# Patient Record
Sex: Female | Born: 1985 | Race: White | Hispanic: No | Marital: Married | Smoking: Never smoker
Health system: Southern US, Community
[De-identification: ages and names within clinical notes are randomized; demographics above are authoritative.]

## PROBLEM LIST (undated history)

## (undated) DIAGNOSIS — O039 Complete or unspecified spontaneous abortion without complication: Principal | ICD-10-CM

## (undated) DIAGNOSIS — Z3490 Encounter for supervision of normal pregnancy, unspecified, unspecified trimester: Principal | ICD-10-CM

## (undated) DIAGNOSIS — Z789 Other specified health status: Secondary | ICD-10-CM

## (undated) HISTORY — DX: Complete or unspecified spontaneous abortion without complication: O03.9

## (undated) HISTORY — DX: Encounter for supervision of normal pregnancy, unspecified, unspecified trimester: Z34.90

## (undated) HISTORY — PX: NO PAST SURGERIES: SHX2092

---

## 2009-02-20 ENCOUNTER — Ambulatory Visit: Payer: Self-pay | Admitting: Advanced Practice Midwife

## 2009-02-20 ENCOUNTER — Inpatient Hospital Stay (HOSPITAL_COMMUNITY): Admission: AD | Admit: 2009-02-20 | Discharge: 2009-02-21 | Payer: Self-pay | Admitting: Family Medicine

## 2009-05-27 ENCOUNTER — Inpatient Hospital Stay (HOSPITAL_COMMUNITY): Admission: AD | Admit: 2009-05-27 | Discharge: 2009-05-29 | Payer: Self-pay | Admitting: Obstetrics & Gynecology

## 2009-05-27 ENCOUNTER — Ambulatory Visit: Payer: Self-pay | Admitting: Advanced Practice Midwife

## 2010-04-05 ENCOUNTER — Emergency Department (HOSPITAL_COMMUNITY)
Admission: EM | Admit: 2010-04-05 | Discharge: 2010-04-05 | Payer: Self-pay | Source: Home / Self Care | Admitting: Emergency Medicine

## 2010-05-01 NOTE — L&D Delivery Note (Signed)
Delivery Note At 6:27 AM a viable female was delivered via Vaginal, Spontaneous Delivery (Presentation: ; Occiput Anterior).  APGAR: 8, 9; weight 9 lb 15 oz (4508 g).   Placenta status: Intact, Spontaneous.  Cord: 3 vessels with the following complications: mild shoulder dystocia.  After delivery of the head the shoulders were noted to be tight.  THe posterior (right) axilla was grasped with my index finger, the baby rotated counterclockwise into an oblique position, and the shoulder relased anteriorally.  The rest of the body delivered without difficulty   Dr. Tana Conch assisted with delivery Anesthesia: Epidural  Episiotomy: None Lacerations: 1st degree Suture Repair: 2.0 vicryl Est. Blood Loss (mL): <500cc  Mom to postpartum.  Baby to nursery-stable.  CRESENZO-DISHMAN,Erika Johnston 03/30/2011, 6:53 AM

## 2010-06-16 LAB — HIV ANTIBODY (ROUTINE TESTING W REFLEX): HIV: NONREACTIVE

## 2010-06-16 LAB — HEPATITIS B SURFACE ANTIGEN: Hepatitis B Surface Ag: NEGATIVE

## 2010-06-16 LAB — RPR: RPR: NONREACTIVE

## 2010-07-12 LAB — URINALYSIS, ROUTINE W REFLEX MICROSCOPIC
Hgb urine dipstick: NEGATIVE
Nitrite: NEGATIVE
Protein, ur: NEGATIVE mg/dL
Specific Gravity, Urine: >1.03 — ABNORMAL HIGH (ref 1.005–1.030)
Urobilinogen, UA: 0.2 mg/dL (ref 0.0–1.0)

## 2010-07-12 LAB — DIFFERENTIAL
Eosinophils Absolute: 0.2 10*3/uL (ref 0.0–0.7)
Eosinophils Relative: 2 % (ref 0–5)
Lymphocytes Relative: 23 % (ref 12–46)
Lymphs Abs: 2 10*3/uL (ref 0.7–4.0)
Monocytes Absolute: 0.9 10*3/uL (ref 0.1–1.0)

## 2010-07-12 LAB — CBC
HCT: 36.9 % (ref 36.0–46.0)
MCH: 31.1 pg (ref 26.0–34.0)
MCV: 87.6 fL (ref 78.0–100.0)
RBC: 4.21 MIL/uL (ref 3.87–5.11)
RDW: 12.6 % (ref 11.5–15.5)
WBC: 8.5 10*3/uL (ref 4.0–10.5)

## 2010-07-12 LAB — RPR: RPR Ser Ql: NONREACTIVE

## 2010-07-12 LAB — ABO/RH: ABO/RH(D): O POS

## 2010-07-12 LAB — PREGNANCY, URINE: Preg Test, Ur: POSITIVE

## 2010-07-12 LAB — WET PREP, GENITAL: Yeast Wet Prep HPF POC: NONE SEEN

## 2010-07-17 LAB — CBC
HCT: 29 % — ABNORMAL LOW (ref 36.0–46.0)
Hemoglobin: 12.4 g/dL (ref 12.0–15.0)
Hemoglobin: 9.7 g/dL — ABNORMAL LOW (ref 12.0–15.0)
MCV: 98.1 fL (ref 78.0–100.0)
RBC: 2.96 MIL/uL — ABNORMAL LOW (ref 3.87–5.11)
RBC: 3.82 MIL/uL — ABNORMAL LOW (ref 3.87–5.11)
WBC: 17.5 10*3/uL — ABNORMAL HIGH (ref 4.0–10.5)

## 2010-07-17 LAB — CCBB MATERNAL DONOR DRAW

## 2010-08-04 LAB — WET PREP, GENITAL: Clue Cells Wet Prep HPF POC: NONE SEEN

## 2011-02-15 LAB — STREP B DNA PROBE: GBS: NEGATIVE

## 2011-03-29 ENCOUNTER — Encounter (HOSPITAL_COMMUNITY): Payer: Self-pay | Admitting: *Deleted

## 2011-03-29 ENCOUNTER — Inpatient Hospital Stay (HOSPITAL_COMMUNITY): Payer: BC Managed Care – PPO | Admitting: Anesthesiology

## 2011-03-29 ENCOUNTER — Encounter (HOSPITAL_COMMUNITY): Payer: Self-pay | Admitting: Anesthesiology

## 2011-03-29 ENCOUNTER — Inpatient Hospital Stay (HOSPITAL_COMMUNITY)
Admission: AD | Admit: 2011-03-29 | Discharge: 2011-03-31 | DRG: 373 | Disposition: A | Discharge status: Home / Self Care | Payer: BC Managed Care – PPO | Source: Ambulatory Visit | Attending: Obstetrics & Gynecology | Admitting: Obstetrics & Gynecology

## 2011-03-29 DIAGNOSIS — IMO0001 Reserved for inherently not codable concepts without codable children: Secondary | ICD-10-CM

## 2011-03-29 HISTORY — DX: Other specified health status: Z78.9

## 2011-03-29 LAB — CBC
HCT: 35.9 % — ABNORMAL LOW (ref 36.0–46.0)
Hemoglobin: 12.2 g/dL (ref 12.0–15.0)
MCV: 94.7 fL (ref 78.0–100.0)
RBC: 3.79 MIL/uL — ABNORMAL LOW (ref 3.87–5.11)
RDW: 13.6 % (ref 11.5–15.5)
WBC: 12.8 10*3/uL — ABNORMAL HIGH (ref 4.0–10.5)

## 2011-03-29 MED ORDER — ONDANSETRON HCL 4 MG/2ML IJ SOLN
4.0000 mg | Freq: Four times a day (QID) | INTRAMUSCULAR | Status: DC | PRN
  Administered 2011-03-30: 4 mg via INTRAVENOUS
  Filled 2011-03-29: qty 2

## 2011-03-29 MED ORDER — CITRIC ACID-SODIUM CITRATE 334-500 MG/5ML PO SOLN: 30.0000 mL | ORAL | Status: DC | PRN

## 2011-03-29 MED ORDER — ACETAMINOPHEN 325 MG PO TABS
650.0000 mg | ORAL_TABLET | ORAL | Status: DC | PRN
  Administered 2011-03-30: 650 mg via ORAL
  Filled 2011-03-29: qty 2

## 2011-03-29 MED ORDER — HYDROXYZINE HCL 50 MG PO TABS: 50.0000 mg | ORAL_TABLET | Freq: Four times a day (QID) | ORAL | Status: DC | PRN

## 2011-03-29 MED ORDER — OXYTOCIN BOLUS FROM INFUSION
500.0000 mL | Freq: Once | INTRAVENOUS | Status: DC
  Filled 2011-03-29: qty 500

## 2011-03-29 MED ORDER — EPHEDRINE 5 MG/ML INJ: 10.0000 mg | INTRAVENOUS | Status: DC | PRN

## 2011-03-29 MED ORDER — DIPHENHYDRAMINE HCL 50 MG/ML IJ SOLN: 12.5000 mg | INTRAMUSCULAR | Status: DC | PRN

## 2011-03-29 MED ORDER — SODIUM BICARBONATE 8.4 % IV SOLN
INTRAVENOUS | Status: DC | PRN
  Administered 2011-03-29: 4 mL via EPIDURAL

## 2011-03-29 MED ORDER — EPHEDRINE 5 MG/ML INJ
10.0000 mg | INTRAVENOUS | Status: DC | PRN
  Filled 2011-03-29: qty 4

## 2011-03-29 MED ORDER — OXYCODONE-ACETAMINOPHEN 5-325 MG PO TABS: 2.0000 | ORAL_TABLET | ORAL | Status: DC | PRN

## 2011-03-29 MED ORDER — FLEET ENEMA 7-19 GM/118ML RE ENEM: 1.0000 | ENEMA | RECTAL | Status: DC | PRN

## 2011-03-29 MED ORDER — LACTATED RINGERS IV SOLN
INTRAVENOUS | Status: DC
  Administered 2011-03-30: 05:00:00 via INTRAVENOUS

## 2011-03-29 MED ORDER — FENTANYL 2.5 MCG/ML BUPIVACAINE 1/10 % EPIDURAL INFUSION (WH - ANES)
14.0000 mL/h | INTRAMUSCULAR | Status: DC
  Administered 2011-03-30 (×2): 14 mL/h via EPIDURAL
  Filled 2011-03-29 (×3): qty 60

## 2011-03-29 MED ORDER — OXYTOCIN 20 UNITS IN LACTATED RINGERS INFUSION - SIMPLE: 125.0000 mL/h | Freq: Once | INTRAVENOUS | Status: DC

## 2011-03-29 MED ORDER — LIDOCAINE HCL (PF) 1 % IJ SOLN: 30.0000 mL | INTRAMUSCULAR | Status: DC | PRN

## 2011-03-29 MED ORDER — HYDROXYZINE HCL 50 MG/ML IM SOLN: 50.0000 mg | Freq: Four times a day (QID) | INTRAMUSCULAR | Status: DC | PRN

## 2011-03-29 MED ORDER — NALBUPHINE SYRINGE 5 MG/0.5 ML: 5.0000 mg | INJECTION | INTRAMUSCULAR | Status: DC | PRN

## 2011-03-29 MED ORDER — IBUPROFEN 600 MG PO TABS: 600.0000 mg | ORAL_TABLET | Freq: Four times a day (QID) | ORAL | Status: DC | PRN

## 2011-03-29 MED ORDER — PHENYLEPHRINE 40 MCG/ML (10ML) SYRINGE FOR IV PUSH (FOR BLOOD PRESSURE SUPPORT)
80.0000 ug | PREFILLED_SYRINGE | INTRAVENOUS | Status: DC | PRN
  Filled 2011-03-29: qty 5

## 2011-03-29 MED ORDER — LACTATED RINGERS IV SOLN: 500.0000 mL | INTRAVENOUS | Status: DC | PRN

## 2011-03-29 MED ORDER — PHENYLEPHRINE 40 MCG/ML (10ML) SYRINGE FOR IV PUSH (FOR BLOOD PRESSURE SUPPORT): 80.0000 ug | PREFILLED_SYRINGE | INTRAVENOUS | Status: DC | PRN

## 2011-03-29 MED ORDER — LACTATED RINGERS IV SOLN: 500.0000 mL | Freq: Once | INTRAVENOUS | Status: DC

## 2011-03-29 MED ORDER — FENTANYL 2.5 MCG/ML BUPIVACAINE 1/10 % EPIDURAL INFUSION (WH - ANES)
INTRAMUSCULAR | Status: DC | PRN
  Administered 2011-03-29: 14 mL/h via EPIDURAL

## 2011-03-29 NOTE — ED Provider Notes (Signed)
Erika Johnston y.V.[id number] @[redacted]w[redacted]d  Chief Complaint  Patient presents with  . Labor Eval    SUBJECTIVE  HPI: Having bloody show and irregular UCs since membranes stripped at office visit yesterday. No LOF or VB. Good FM. Lives 1 hr away. Pregnancy uncomplicated with care at [facility] Past Medical History  Diagnosis Date  . No pertinent past medical history    Past Surgical History  Procedure Date  . No past surgeries    History   Social History  . Marital Status: Married    Spouse Name: N/A    Number of Children: N/A  . Years of Education: N/A   Occupational History  . Not on file.   Social History Main Topics  . Smoking status: Never Smoker   . Smokeless tobacco: Not on file  . Alcohol Use: No  . Drug Use: No  . Sexually Active: Yes   Other Topics Concern  . Not on file   Social History Narrative  . No narrative on file   No current facility-administered medications on file prior to encounter.   No current outpatient prescriptions on file prior to encounter.   No Known Allergies  ROS: Pertinent items in HPI  OBJECTIVE  BP 122/60  Pulse 96  Temp(Src) 98.5 F (36.9 C) (Oral)  Resp 18  Ht 5\' 5"  (1.651 m)  Wt 108.863 kg (240 lb)  BMI 39.94 kg/m2   Toco: irreg UCs q 3-10 FHR: 130 baseline, moderate variability, reactive  Mau course: Will observe in Mau 1-2 hr with cx recheck. Exie Parody to assume care.  ASSESSMENT     PLAN

## 2011-03-29 NOTE — Progress Notes (Signed)
Pt in for labor eval, reports ucs q2-20 minutes apart.  Reports some spotting and blood tinged mucus, had cervix checked yesterday and membranes swept.  Denies any gushes of fluid.  Decreased fetal movement since contractions started.

## 2011-03-29 NOTE — Anesthesia Preprocedure Evaluation (Signed)
Anesthesia Evaluation  Patient identified by MRN, date of birth, ID band Patient awake    Reviewed: Allergy & Precautions, H&P , Patient's Chart, lab work & pertinent test results  Airway Mallampati: II TM Distance: >3 FB Neck ROM: full    Dental  (+) Teeth Intact   Pulmonary  clear to auscultation        Cardiovascular regular Normal    Neuro/Psych    GI/Hepatic   Endo/Other  Morbid obesity  Renal/GU      Musculoskeletal   Abdominal   Peds  Hematology   Anesthesia Other Findings       Reproductive/Obstetrics (+) Pregnancy                           Anesthesia Physical Anesthesia Plan  ASA: III  Anesthesia Plan: Epidural   Post-op Pain Management:    Induction:   Airway Management Planned:   Additional Equipment:   Intra-op Plan:   Post-operative Plan:   Informed Consent: I have reviewed the patients History and Physical, chart, labs and discussed the procedure including the risks, benefits and alternatives for the proposed anesthesia with the patient or authorized representative who has indicated his/her understanding and acceptance.   Dental Advisory Given  Plan Discussed with:   Anesthesia Plan Comments: (Labs checked- platelets confirmed with RN in room. Fetal heart tracing, per RN, reported to be stable enough for sitting procedure. Discussed epidural, and patient consents to the procedure:  included risk of possible headache,backache, failed block, allergic reaction, and nerve injury. This patient was asked if she had any questions or concerns before the procedure started. )        Anesthesia Quick Evaluation  

## 2011-03-29 NOTE — Anesthesia Procedure Notes (Signed)
Epidural Patient location during procedure: OB  Preanesthetic Checklist Completed: patient identified, site marked, surgical consent, pre-op evaluation, timeout performed, IV checked, risks and benefits discussed and monitors and equipment checked  Epidural Patient position: sitting Prep: site prepped and draped and DuraPrep Patient monitoring: continuous pulse ox and blood pressure Approach: midline Injection technique: LOR air  Needle:  Needle type: Tuohy  Needle gauge: 17 G Needle length: 9 cm Needle insertion depth: 7 cm Catheter type: closed end flexible Catheter size: 19 Gauge Catheter at skin depth: 13 cm Test dose: negative  Assessment Events: blood not aspirated, injection not painful, no injection resistance, negative IV test and no paresthesia  Additional Notes Dosing of Epidural:  1st dose, through needle ............................................Marland Kitchen epi 1:200K + Xylocaine 40 mg  2nd dose, through catheter, after waiting 3 minutes...Marland KitchenMarland Kitchenepi 1:200K + Xylocaine 40 mg  3rd dose, through catheter after waiting 3 minutes .............................Marcaine   4mg    ( mg Marcaine are expressed as equivilent  cc's medication removed from the 0.1%Bupiv / fentanyl syringe from L&D pump)  ( 2% Xylo charted as a single dose in Epic Meds for ease of charting; actual dosing was fractionated as above, for saftey's sake)  As each dose occurred, patient was free of IV sx; and patient exhibited no evidence of SA injection.  Patient is more comfortable after epidural dosed. Please see RN's note for documentation of vital signs,and FHR which are stable.

## 2011-03-29 NOTE — H&P (Signed)
  Erika Johnston is a 25 y.o. female [phone] with IUP at [redacted]w[redacted]d presenting for contractions. Pt states she has been having regular, every 4-5 minutes, associated with none vaginal bleeding, intact, with active.  [facility]  Prenatal History/Complications:  Past Medical History: Past Medical History  Diagnosis Date  . No pertinent past medical history     Past Surgical History: Past Surgical History  Procedure Date  . No past surgeries     Obstetrical History: OB History    Grav Para Term Preterm Abortions TAB SAB Ect Mult Living   4 1 1  0 2 0 2 0 0 1      Gynecological History: OB History    Grav Para Term Preterm Abortions TAB SAB Ect Mult Living   4 1 1  0 2 0 2 0 0 1      Social History: History   Social History  . Marital Status: Married    Spouse Name: N/A    Number of Children: N/A  . Years of Education: N/A   Social History Main Topics  . Smoking status: Never Smoker   . Smokeless tobacco: None  . Alcohol Use: No  . Drug Use: No  . Sexually Active: Yes   Other Topics Concern  . None   Social History Narrative  . None    Family History: Family History  Problem Relation Age of Onset  . Anesthesia problems Neg Hx   . Hypotension Neg Hx   . Malignant hyperthermia Neg Hx   . Pseudochol deficiency Neg Hx     Allergies: No Known Allergies  Prescriptions prior to admission  Medication Sig Dispense Refill  . acetaminophen (TYLENOL) 500 MG tablet Take 1,000 mg by mouth every 6 (six) hours as needed. Patient was using this medication for a headache.       . folic acid (FOLVITE) 800 MCG tablet Take 400 mcg by mouth daily.        . Prenat-FeFum-FePo-FA-Omega 3 (CONCEPT DHA PO) Take 1 tablet by mouth daily.        . prenatal vitamin w/FE, FA (PRENATAL 1 + 1) 27-1 MG TABS Take 1 tablet by mouth daily.          Review of Systems - Negative    Blood pressure 122/60, pulse 96, temperature 98.5 F (36.9 C),  temperature source Oral, resp. rate 18, height 5\' 5"  (1.651 m), weight 108.863 kg (240 lb). General appearance: alert, cooperative and mild distress Lungs: clear to auscultation bilaterally Heart: regular rate and rhythm Abdomen: soft, non-tender; bowel sounds normal Extremities: Homans sign is negative, no sign of DVT DTR's 2+ Presentation: cephalic Baseline: 140 bpm, avg LTV, + accels, no decels Contractions q 3-93minutes Dilation: 6.5 Effacement (%): 80 Station: -2 Exam by:: F. Cres-Dishmon, CNM   Prenatal labs: ABO, Rh: --/--/O POS (12/06 0740) Antibody:  neg Rubella:  immune RPR: NON REACTIVE (12/06 0732)  HBsAg:   neg HIV:   neg GBS:    2 hr Glucola 82/147/102 Genetic screening  normal Anatomy US normal   Assessment: Erika Johnston is a 25 y.o. [phone] with an IUP at [redacted]w[redacted]d presenting for active labor  Plan: Expectant management   CRESENZO-DISHMAN,Oleg Oleson 03/29/2011, 9:39 PM

## 2011-03-30 ENCOUNTER — Encounter (HOSPITAL_COMMUNITY): Payer: Self-pay

## 2011-03-30 ENCOUNTER — Encounter (HOSPITAL_COMMUNITY): Payer: Self-pay | Admitting: *Deleted

## 2011-03-30 LAB — CBC
MCV: 95.5 fL (ref 78.0–100.0)
Platelets: 107 10*3/uL — ABNORMAL LOW (ref 150–400)
RDW: 13.5 % (ref 11.5–15.5)
WBC: 15.6 10*3/uL — ABNORMAL HIGH (ref 4.0–10.5)

## 2011-03-30 LAB — RPR: RPR Ser Ql: NONREACTIVE

## 2011-03-30 MED ORDER — DIBUCAINE 1 % RE OINT: 1.0000 "application " | TOPICAL_OINTMENT | RECTAL | Status: DC | PRN

## 2011-03-30 MED ORDER — WITCH HAZEL-GLYCERIN EX PADS: 1.0000 "application " | MEDICATED_PAD | CUTANEOUS | Status: DC | PRN

## 2011-03-30 MED ORDER — ONDANSETRON HCL 4 MG/2ML IJ SOLN: 4.0000 mg | INTRAMUSCULAR | Status: DC | PRN

## 2011-03-30 MED ORDER — TERBUTALINE SULFATE 1 MG/ML IJ SOLN: 0.2500 mg | Freq: Once | INTRAMUSCULAR | Status: DC | PRN

## 2011-03-30 MED ORDER — BENZOCAINE-MENTHOL 20-0.5 % EX AERO
INHALATION_SPRAY | CUTANEOUS | Status: AC
  Filled 2011-03-30: qty 56

## 2011-03-30 MED ORDER — DIPHENHYDRAMINE HCL 25 MG PO CAPS: 25.0000 mg | ORAL_CAPSULE | Freq: Four times a day (QID) | ORAL | Status: DC | PRN

## 2011-03-30 MED ORDER — SENNOSIDES-DOCUSATE SODIUM 8.6-50 MG PO TABS
2.0000 | ORAL_TABLET | Freq: Every day | ORAL | Status: DC
  Administered 2011-03-30: 2 via ORAL

## 2011-03-30 MED ORDER — LANOLIN HYDROUS EX OINT: TOPICAL_OINTMENT | CUTANEOUS | Status: DC | PRN

## 2011-03-30 MED ORDER — OXYCODONE-ACETAMINOPHEN 5-325 MG PO TABS: 1.0000 | ORAL_TABLET | ORAL | Status: DC | PRN

## 2011-03-30 MED ORDER — SIMETHICONE 80 MG PO CHEW: 80.0000 mg | CHEWABLE_TABLET | ORAL | Status: DC | PRN

## 2011-03-30 MED ORDER — OXYTOCIN 20 UNITS IN LACTATED RINGERS INFUSION - SIMPLE
1.0000 m[IU]/min | INTRAVENOUS | Status: DC
  Administered 2011-03-30: 2 m[IU]/min via INTRAVENOUS
  Filled 2011-03-30: qty 1000

## 2011-03-30 MED ORDER — ONDANSETRON HCL 4 MG PO TABS: 4.0000 mg | ORAL_TABLET | ORAL | Status: DC | PRN

## 2011-03-30 MED ORDER — IBUPROFEN 600 MG PO TABS
600.0000 mg | ORAL_TABLET | Freq: Four times a day (QID) | ORAL | Status: DC
  Administered 2011-03-30 – 2011-03-31 (×4): 600 mg via ORAL
  Filled 2011-03-30 (×4): qty 1

## 2011-03-30 MED ORDER — BENZOCAINE-MENTHOL 20-0.5 % EX AERO: 1.0000 "application " | INHALATION_SPRAY | CUTANEOUS | Status: DC | PRN

## 2011-03-30 MED ORDER — ZOLPIDEM TARTRATE 5 MG PO TABS: 5.0000 mg | ORAL_TABLET | Freq: Every evening | ORAL | Status: DC | PRN

## 2011-03-30 MED ORDER — PRENATAL PLUS 27-1 MG PO TABS
1.0000 | ORAL_TABLET | Freq: Every day | ORAL | Status: DC
  Administered 2011-03-30: 1 via ORAL
  Filled 2011-03-30: qty 1

## 2011-03-30 NOTE — Progress Notes (Addendum)
Erika Johnston is a 25 y.o. [phone] at [redacted]w[redacted]d  admitted for active labor  Subjective: Just got epidural, more comfortable.   Objective: BP 120/63  Pulse 88  Temp(Src) 98.1 F (36.7 C) (Oral)  Resp 18  Ht 5\' 5"  (1.651 m)  Wt 108.863 kg (240 lb)  BMI 39.94 kg/m2  SpO2 99%      FHT:  FHR: 160 bpm, variability: moderate,  accelerations:  Present,  decelerations:  Absent UC:   regular, every 5 minutes SVE:   Dilation: 6.5 Effacement (%): 80 Station: -1 Exam by:: Dr. Durene Cal   Labs: Lab Results  Component Value Date   WBC 12.8* 03/29/2011   HGB 12.2 03/29/2011   HCT 35.9* 03/29/2011   MCV 94.7 03/29/2011   PLT 114* 03/29/2011    Assessment / Plan: Spontaneous labor, progressing normally Broke patient's amniotic sac.   Labor: Progressing normally. At next check in 1 hour if not progressing, will start pitocin.  Fetal Wellbeing:  Category I Pain Control:  Epidural I/D:  n/a Anticipated MOD:  NSVD  HUNTER, STEPHEN 03/30/2011, 12:35 AM

## 2011-03-30 NOTE — Progress Notes (Signed)
   Erika Johnston is a 25 y.o. [phone] at [redacted]w[redacted]d  admitted for active labor  Subjective: Comfortable with epidural  Objective: BP 105/72  Pulse 83  Temp(Src) 97.7 F (36.5 C) (Oral)  Resp 20  Ht 5\' 5"  (1.651 m)  Wt 108.863 kg (240 lb)  BMI 39.94 kg/m2  SpO2 99%    FHT:  FHR: 130 bpm, variability: moderate,  accelerations:  Present,  decelerations:  Absent UC:   regular, every 2-3 minutes SVE:   Dilation: Lip/rim Effacement (%): 100 Station: -1 Exam by:: H.Norton, RN   Labs: Lab Results  Component Value Date   WBC 12.8* 03/29/2011   HGB 12.2 03/29/2011   HCT 35.9* 03/29/2011   MCV 94.7 03/29/2011   PLT 114* 03/29/2011    Assessment / Plan: Augmentation of labor, progressing well  Labor: Progressing normally Fetal Wellbeing:  Category I Pain Control:  Epidural Anticipated MOD:  NSVD  CRESENZO-DISHMAN,Erika Johnston 03/30/2011, 3:23 AM

## 2011-03-30 NOTE — Anesthesia Postprocedure Evaluation (Signed)
  Anesthesia Post-op Note  Patient: Erika Johnston  Procedure(s) Performed: * No surgery found *  Patient Location: PACU and Women's Unit  Anesthesia Type: Epidural  Level of Consciousness: awake, alert , oriented and patient cooperative  Airway and Oxygen Therapy: Patient Spontanous Breathing  Post-op Pain: none  Post-op Assessment: Post-op Vital signs reviewed  Post-op Vital Signs: Reviewed and stable  Complications: No apparent anesthesia complications

## 2011-03-31 MED ORDER — IBUPROFEN 600 MG PO TABS: 600.0000 mg | ORAL_TABLET | Freq: Four times a day (QID) | ORAL | Status: AC

## 2011-03-31 NOTE — Progress Notes (Signed)
Post Partum Day 1 Subjective: no complaints, up ad lib, voiding and tolerating PO.  Pain well controlled with motrin.  Breastfeeding well.  Lochia normal.  Objective: Blood pressure 106/66, pulse 67, temperature 97.9 F (36.6 C), temperature source Oral, resp. rate 18, height 5\' 5"  (1.651 m), weight 108.863 kg (240 lb), SpO2 98.00%, unknown if currently breastfeeding.  Physical Exam:  General: alert, cooperative and no distress Lochia: appropriate Uterine Fundus: firm DVT Evaluation: No evidence of DVT seen on physical exam.   Basename 03/30/11 0800 03/29/11 2233  HGB 11.4* 12.2  HCT 33.7* 35.9*    Assessment/Plan: Discharge home, Breastfeeding and Contraception Diaphragm   LOS: 2 days   Laurieanne Galloway JEHIEL 03/31/2011, 6:48 AM

## 2011-03-31 NOTE — Discharge Summary (Signed)
Obstetric Discharge Summary Reason for Admission: onset of labor Prenatal Procedures: none Intrapartum Procedures: spontaneous vaginal delivery Postpartum Procedures: none Complications-Operative and Postpartum: first degree perineal laceration Hemoglobin  Date Value Range Status  03/30/2011 11.4* 12.0-15.0 (g/dL) Final     HCT  Date Value Range Status  03/30/2011 33.7* 36.0-46.0 (%) Final    Discharge Diagnoses: Term Pregnancy-delivered  Discharge Information: Date: 03/31/2011 Activity: pelvic rest Diet: routine Medications: PNV and Ibuprofen Condition: stable Instructions: refer to practice specific booklet Discharge to: home Follow-up Information    Follow up with [facility]. Make an appointment in 6 weeks. (postpartum appointment)          Newborn Data: Live born female  Birth Weight: 9 lb 15 oz (4508 g) APGAR: 8, 9  Home with mother.  STINSON, JACOB JEHIEL 03/31/2011, 6:51 AM

## 2011-03-31 NOTE — Progress Notes (Signed)
UR Chart review completed.  

## 2011-05-11 ENCOUNTER — Ambulatory Visit: Payer: BC Managed Care – PPO | Admitting: Urgent Care

## 2011-05-17 ENCOUNTER — Ambulatory Visit (INDEPENDENT_AMBULATORY_CARE_PROVIDER_SITE_OTHER): Payer: BC Managed Care – PPO | Admitting: Gastroenterology

## 2011-05-17 ENCOUNTER — Encounter: Payer: Self-pay | Admitting: Gastroenterology

## 2011-05-17 VITALS — BP 115/75 | HR 97 | Temp 98.4°F | Ht 65.0 in | Wt 214.6 lb

## 2011-05-17 DIAGNOSIS — K5900 Constipation, unspecified: Secondary | ICD-10-CM

## 2011-05-17 DIAGNOSIS — R1011 Right upper quadrant pain: Secondary | ICD-10-CM | POA: Insufficient documentation

## 2011-05-17 MED ORDER — HYDROCORTISONE ACETATE 25 MG RE SUPP: 25.0000 mg | Freq: Two times a day (BID) | RECTAL | Status: DC

## 2011-05-17 NOTE — Assessment & Plan Note (Signed)
Noted constipation with hard stools. Intermittent rectal bleeding noted, specifically after passage of hard stools. Obvious small external hemorrhoids noted on external exam, pt reports hx of internal hemorrhoids as well. Currently no treatment for constipation or hemorrhoids. Pt will need a colonoscopy in the near future; however, she is 6 weeks post-partum and still breastfeeding quite frequently. She would like to wait a few weeks before scheduling this as she recovers. This should be reasonable, and we will institute a regimen to assist with constipation and treat known hemorrhoids.   HF diet Miralax prn Anusol supp X 10 days Return in 6 weeks to set up TCS Contact us if any changes at all in status and will be seen sooner

## 2011-05-17 NOTE — Assessment & Plan Note (Signed)
RUQ pain noted with onset in pregnancy, intermittent in nature, worsened after eating at times. Last episode after lasagna. No associated N/V, reflux, dysphagia. Question biliary origin at this point. Will proceed with [place] of abdomen as well as HFP. In the interim, follow low-fat diet. Further recommendations once completed. As of note, RLQ pain described has not occurred in 2 weeks. Will obtain recent vaginal US done by OB for our records. Question if this is a component of constipation, addressed under constipation .

## 2011-05-17 NOTE — Patient Instructions (Signed)
Continue to drink plenty of water and fluids during the day. Start following the high-fiber diet that we have provided. Also, start avoiding fatty, greasy foods. A handout has been provided as well. You may take Miralax 1 capful as needed for constipation. However, hopefully adding fiber to your diet will help with the constipation.  I've ordered a 10 day course of suppositories to help treat the hemorrhoids.   Please have blood work completed as well as ultrasound. We will call you with results.   We will see you back in 6 weeks for reassessment of constipation as well as to set you up for a colonoscopy.   If you have any questions in the meantime, please call our office!  Congratulations on the new baby!

## 2011-05-17 NOTE — Progress Notes (Signed)
Referring Provider: Lazaro Arms, MD Primary Care Physician:  Erika Arms, MD, MD Primary Gastroenterologist:  Erika Johnston   Chief Complaint  Patient presents with  . Abdominal Pain    right side pain    HPI:   Erika Johnston is a 26 year old female who presents today at the request of Erika Johnston, ANP secondary to abdominal pain and hemorrhoids. She just had a baby girl 6 weeks ago. This is her second. She reports intermittent RUQ pain that started during pregnancy, flares waking her up at night, wrapping around to her back. She notes it worsened after meals, too. Last time noticed was after large meal of lasagna. No associated N/V, reflux, dysphagia. Also notes RLQ pain, intermittent, wraps around back. Not associated with BMs. This was not present while she was pregnant. Started after delivery but has not bothered her for 2 weeks. When it did occur, would be constant.   Does have known external/internal hemorrhoids. Hx of rectal bleeding for several years. Was seen by a GI practice in [place] and recommended colonoscopy. She did not proceed at that time. Just has bleeding with hard stool. Sometimes after hard stool will be painful in rectum. Since delivery, has BM every 3-4 days. Drinking plenty of water.   She is currently breastfeeding.    Past Medical History  Diagnosis Date  . No pertinent past medical history     Past Surgical History  Procedure Date  . No past surgeries     Current Outpatient Prescriptions  Medication Sig Dispense Refill  . acetaminophen (TYLENOL) 500 MG tablet Take 1,000 mg by mouth every 6 (six) hours as needed. Patient was using this medication for a headache.       . folic acid (FOLVITE) 800 MCG tablet Take 400 mcg by mouth daily.        . Prenatal Vit-Fe Fumarate-FA (PRENATAL MULTIVITAMIN) TABS Take 1 tablet by mouth daily.      . hydrocortisone (ANUSOL-HC) 25 MG suppository Place 1 suppository (25 mg total) rectally every 12 (twelve) hours. For  10 days.  20 suppository  0  . Prenat-FeFum-FePo-FA-Omega 3 (CONCEPT DHA PO) Take 1 tablet by mouth daily.          Allergies as of 05/17/2011  . (No Known Allergies)    Family History  Problem Relation Age of Onset  . Anesthesia problems Neg Hx   . Hypotension Neg Hx   . Malignant hyperthermia Neg Hx   . Pseudochol deficiency Neg Hx   . Cancer Maternal Grandmother   . Colon cancer Neg Hx     History   Social History  . Marital Status: Married    Spouse Name: N/A    Number of Children: N/A  . Years of Education: N/A   Occupational History  . Not on file.   Social History Main Topics  . Smoking status: Never Smoker   . Smokeless tobacco: Not on file  . Alcohol Use: No  . Drug Use: No  . Sexually Active: Yes   Other Topics Concern  . Not on file   Social History Narrative  . No narrative on file    Review of Systems: Gen: Denies any fever, chills, loss of appetite, fatigue, weight loss. CV: Denies chest pain, heart palpitations, syncope, peripheral edema. Resp: Denies shortness of breath with rest, cough, wheezing GI: Denies dysphagia or odynophagia. Denies hematemesis, fecal incontinence, or jaundice.  GU : Denies urinary burning, urinary frequency, urinary incontinence.  MS: Denies joint pain,  muscle weakness, cramps, limited movement Derm: Denies rash, itching, dry skin Psych: Denies depression, anxiety, confusion or memory loss  Heme: Denies bruising, bleeding, and enlarged lymph nodes.  Physical Exam: BP 115/75  Pulse 97  Temp(Src) 98.4 F (36.9 C) (Temporal)  Ht 5\' 5"  (1.651 m)  Wt 214 lb 9.6 oz (97.342 kg)  BMI 35.71 kg/m2  Breastfeeding? Yes General:   Alert and oriented. Well-developed, well-nourished, pleasant and cooperative. Head:  Normocephalic and atraumatic. Eyes:  Conjunctiva pink, sclera clear, no icterus.   Conjunctiva pink. Ears:  Normal auditory acuity. Nose:  No deformity, discharge,  or lesions. Mouth:  No deformity or lesions,  mucosa pink and moist.  Neck:  Supple, without mass or thyromegaly. Lungs:  Clear to auscultation bilaterally, without wheezing, rales, or rhonchi.  Heart:  S1, S2 present without murmurs noted.  Abdomen:  +BS, soft, non-tender and non-distended. Without mass or HSM. No rebound or guarding. No hernias noted. Rectal:  2 small non-thrombosed external hemorrhoids, no TTP, no gross blood noted Msk:  Symmetrical without gross deformities. Normal posture. Pulses:  Normal pulses noted. Extremities:  Without clubbing or edema. Neurologic:  Alert and  oriented x4;  grossly normal neurologically. Skin:  Intact, warm and dry without significant lesions or rashes Cervical Nodes:  No significant cervical adenopathy. Psych:  Alert and cooperative. Normal mood and affect.

## 2011-05-18 NOTE — Progress Notes (Signed)
Faxed to PCP

## 2011-05-24 NOTE — Progress Notes (Signed)
PT NEEDS RUQ U/S FOR RUQ PAIN. SHE SHOULD FOLLOW A LOW FAT DIET.  NEEDS TCS FOR RECTAL BLEEDING.   REVIEWED.

## 2011-06-02 ENCOUNTER — Ambulatory Visit (HOSPITAL_COMMUNITY)
Admission: RE | Admit: 2011-06-02 | Discharge: 2011-06-02 | Disposition: A | Discharge status: Home / Self Care | Payer: BC Managed Care – PPO | Source: Ambulatory Visit | Attending: Gastroenterology | Admitting: Gastroenterology

## 2011-06-02 DIAGNOSIS — R1011 Right upper quadrant pain: Secondary | ICD-10-CM | POA: Insufficient documentation

## 2011-06-05 ENCOUNTER — Telehealth: Payer: Self-pay | Admitting: Gastroenterology

## 2011-06-05 NOTE — Telephone Encounter (Signed)
Patient is asking for [place] results please advise??

## 2011-06-05 NOTE — Telephone Encounter (Signed)
Please see result notes.  

## 2011-06-05 NOTE — Progress Notes (Signed)
Quick Note:  No stones noted. We need her to complete the labs ordered at her visit: HFP and TSH. Depending on these labs, may proceed with a HIDA.  How is pt doing? ______

## 2011-06-05 NOTE — Progress Notes (Signed)
Quick Note:  Pt informed. Said she is still having abdominal pain on the right side. She is going this morning to get her labs. ______

## 2011-06-05 NOTE — Progress Notes (Signed)
Quick Note:  LMOM to call. ______ 

## 2011-06-05 NOTE — Progress Notes (Signed)
Quick Note:  Great! ______ 

## 2011-06-05 NOTE — Telephone Encounter (Signed)
I have addressed under result notes and sent to [facility].  Let me know if you have questions. Thanks!

## 2011-06-07 ENCOUNTER — Telehealth: Payer: Self-pay | Admitting: Gastroenterology

## 2011-06-07 LAB — HEPATIC FUNCTION PANEL
Albumin: 4.6 g/dL (ref 3.5–5.2)
Total Bilirubin: 0.3 mg/dL (ref 0.3–1.2)
Total Protein: 7.3 g/dL (ref 6.0–8.3)

## 2011-06-07 NOTE — Telephone Encounter (Signed)
Routed to DS 

## 2011-06-07 NOTE — Telephone Encounter (Signed)
Asking for lab results please advise??

## 2011-06-08 NOTE — Telephone Encounter (Signed)
Routing to Crystal to schedule HIDA.

## 2011-06-08 NOTE — Telephone Encounter (Signed)
Labs normal. Proceed with HIDA and follow-up as planned. Looks like she is seeing [facility]; if possible could we switch to myself as I have seen her before?

## 2011-06-08 NOTE — Progress Notes (Signed)
Quick Note:  Labs are normal.  I believe we are doing HIDA? ______

## 2011-06-08 NOTE — Telephone Encounter (Signed)
Informed pt of results. She is aware that Crystal will schedule HIDA. Tobi Bastos is going to check on the breast feeding recommendations. OV appt has been rescheduled to see Gerrit Halls, NP on 06/26/2011 @ 10:30 AM.

## 2011-06-08 NOTE — Telephone Encounter (Signed)
LMOM to call.

## 2011-06-09 ENCOUNTER — Other Ambulatory Visit: Payer: Self-pay | Admitting: Gastroenterology

## 2011-06-09 DIAGNOSIS — R1011 Right upper quadrant pain: Secondary | ICD-10-CM

## 2011-06-09 NOTE — Telephone Encounter (Signed)
HIDA is scheduled for 02/13 @ 8- per Dr Tyron Russell in Radiology- pt should breast feed right before procedure, pump once, throw away, then may resume regular feeding after that - LM for pt to call me back

## 2011-06-09 NOTE — Progress Notes (Signed)
Quick Note:  See phone note dated 06/07/2011. ______

## 2011-06-12 ENCOUNTER — Telehealth: Payer: Self-pay | Admitting: Gastroenterology

## 2011-06-12 NOTE — Telephone Encounter (Signed)
Called pt again, LMOVM for her to call me back regarding HIDA scan

## 2011-06-14 ENCOUNTER — Other Ambulatory Visit (HOSPITAL_COMMUNITY): Payer: BC Managed Care – PPO

## 2011-06-19 ENCOUNTER — Encounter (HOSPITAL_COMMUNITY)
Admission: RE | Admit: 2011-06-19 | Discharge: 2011-06-19 | Disposition: A | Discharge status: Home / Self Care | Payer: BC Managed Care – PPO | Source: Ambulatory Visit | Attending: Gastroenterology | Admitting: Gastroenterology

## 2011-06-19 ENCOUNTER — Encounter (HOSPITAL_COMMUNITY): Payer: Self-pay

## 2011-06-19 DIAGNOSIS — R1011 Right upper quadrant pain: Secondary | ICD-10-CM | POA: Insufficient documentation

## 2011-06-19 MED ORDER — SINCALIDE 5 MCG IJ SOLR
INTRAMUSCULAR | Status: AC
  Administered 2011-06-19: 1.91 ug
  Filled 2011-06-19: qty 5

## 2011-06-19 MED ORDER — TECHNETIUM TC 99M MEBROFENIN IV KIT
5.0000 | PACK | Freq: Once | INTRAVENOUS | Status: AC | PRN
  Administered 2011-06-19: 5.2 via INTRAVENOUS

## 2011-06-19 NOTE — Progress Notes (Signed)
Quick Note:  I will be seeing Erika Johnston next week, but you can tell her that the gallbladder empties normally according to radiologist. However, they believe the ejection fraction, which tells how well it is working, was falsely low. I believe she did have pain with the CCK infusion from looking at the report.   I will discuss more in depth with her at the upcoming appt. May consider EGD if any upper GI symptoms; however, may refer for elective cholecystectomy depending on her presentation when I see her next week. ______

## 2011-06-20 NOTE — Progress Notes (Signed)
Quick Note:  LMOM to call. ______ 

## 2011-06-20 NOTE — Progress Notes (Signed)
Quick Note:  Pt returned call and was informed. She has appt on 06/26/2011. ______

## 2011-06-26 ENCOUNTER — Ambulatory Visit: Payer: BC Managed Care – PPO | Admitting: Gastroenterology

## 2011-06-26 ENCOUNTER — Encounter: Payer: Self-pay | Admitting: Gastroenterology

## 2011-06-26 ENCOUNTER — Ambulatory Visit (INDEPENDENT_AMBULATORY_CARE_PROVIDER_SITE_OTHER): Payer: BC Managed Care – PPO | Admitting: Gastroenterology

## 2011-06-26 DIAGNOSIS — K5900 Constipation, unspecified: Secondary | ICD-10-CM

## 2011-06-26 DIAGNOSIS — R1011 Right upper quadrant pain: Secondary | ICD-10-CM

## 2011-06-26 DIAGNOSIS — K625 Hemorrhage of anus and rectum: Secondary | ICD-10-CM

## 2011-06-26 MED ORDER — HYDROCORTISONE ACETATE 25 MG RE SUPP: 25.0000 mg | Freq: Two times a day (BID) | RECTAL | Status: DC

## 2011-06-26 NOTE — Patient Instructions (Signed)
We will be calling you to set up your colonoscopy with Dr. Darrick Penna.   We will discuss how to proceed with breast feeding at that time.  I've sent a prescription for suppositories to your pharmacy.   We will be in touch shortly!

## 2011-06-27 DIAGNOSIS — K625 Hemorrhage of anus and rectum: Secondary | ICD-10-CM | POA: Insufficient documentation

## 2011-06-27 NOTE — Assessment & Plan Note (Addendum)
Intermittent. Likely benign source. Will proceed with TCS in near future.   Proceed with colonoscopy with Dr. Darrick Penna in the near future. The risks, benefits, and alternatives have been discussed in detail with the patient. They state understanding and desire to proceed.  PT IS BREASTFEEDING>  Addendum 3/11: reviewed with [facility] pt's desire for Fentanyl. May use Fentanyl; pt will STILL need to pump and dump. Sent to referral coordinator to inform pt.

## 2011-06-27 NOTE — Assessment & Plan Note (Signed)
Improved with fiber. Miralax not needed. Continue high fiber diet and proceed with TCS due to rectal bleeding.

## 2011-06-27 NOTE — Progress Notes (Signed)
Referring Provider: Eure, Luther H, MD Primary Care Physician:  No primary provider on file. Primary Gastroenterologist: Dr. Fields   Chief Complaint  Patient presents with  . Follow-up    HPI:   Erika Johnston is a 26-year-old female who presents today in follow-up for abdominal pain, specifically RUQ and constipation. She has a baby girl who is still breastfeeding. Was set up for US of abd and subsequently HIDA at last appt due to concerns for biliary etiology. Also started on Miralax due to constipation. Hx of rectal bleeding and reports hemorrhoids in past. HFP normal in Feb 2013.  Returns today with continued RUQ/right-sided intermittent abdominal discomfort without precipitating factors. Feels like a pressure, "hard to describe". Denies reflux, N/V. Pain not worsened with eating. Eats high fat foods at time without any aggravation.   Has not had to take Miralax for constipation. States fiber bars helping significantly. Anusol suppositories helped, but she did not take as directed. Sometimes uncomfortable with bowel movements, especially if large.    US of abd: IMPRESSION:  Slightly prominent hepatic size suggested sonographically with no  focal or diffuse hepatic abnormality otherwise identified.  Otherwise unremarkable abdominal ultrasound  HIDA: IMPRESSION:  Normal hepatobiliary scan, with a falsely low ejection fraction  Study. + pain during CCK infusion.     Past Medical History  Diagnosis Date  . No pertinent past medical history     Past Surgical History  Procedure Date  . No past surgeries     Current Outpatient Prescriptions  Medication Sig Dispense Refill  . acetaminophen (TYLENOL) 500 MG tablet Take 1,000 mg by mouth every 6 (six) hours as needed. Patient was using this medication for a headache.       . amoxicillin (AMOXIL) 500 MG capsule Take 500 mg by mouth 3 (three) times daily.       . folic acid (FOLVITE) 800 MCG tablet Take 400 mcg by mouth daily.        .  Prenat-FeFum-FePo-FA-Omega 3 (CONCEPT DHA PO) Take 1 tablet by mouth daily.        . Prenatal Vit-Fe Fumarate-FA (PRENATAL MULTIVITAMIN) TABS Take 1 tablet by mouth daily.      . hydrocortisone (ANUSOL-HC) 25 MG suppository Place 1 suppository (25 mg total) rectally every 12 (twelve) hours. For 10 days.  20 suppository  0    Allergies as of 06/26/2011  . (No Known Allergies)    Family History  Problem Relation Age of Onset  . Anesthesia problems Neg Hx   . Hypotension Neg Hx   . Malignant hyperthermia Neg Hx   . Pseudochol deficiency Neg Hx   . Cancer Maternal Grandmother   . Colon cancer Neg Hx     History   Social History  . Marital Status: Married    Spouse Name: N/A    Number of Children: N/A  . Years of Education: N/A   Social History Main Topics  . Smoking status: Never Smoker   . Smokeless tobacco: None  . Alcohol Use: No  . Drug Use: No  . Sexually Active: Yes   Other Topics Concern  . None   Social History Narrative  . None    Review of Systems: Gen: Denies fever, chills, anorexia. Denies fatigue, weakness, weight loss.  CV: Denies chest pain, palpitations, syncope, peripheral edema, and claudication. Resp: Denies dyspnea at rest, cough, wheezing, coughing up blood, and pleurisy. GI: Denies vomiting blood, jaundice, and fecal incontinence.   Denies dysphagia or odynophagia. Derm: Denies   rash, itching, dry skin Psych: Denies depression, anxiety, memory loss, confusion. No homicidal or suicidal ideation.  Heme: Denies bruising, bleeding, and enlarged lymph nodes.  Physical Exam: BP 118/71  Pulse 80  Temp(Src) 98.1 F (36.7 C) (Temporal)  Ht 5' 5" (1.651 m)  Wt 217 lb 9.6 oz (98.703 kg)  BMI 36.21 kg/m2  Breastfeeding? Yes General:   Alert and oriented. No distress noted. Pleasant and cooperative.  Head:  Normocephalic and atraumatic. Eyes:  Conjuctiva clear without scleral icterus. Mouth:  Oral mucosa pink and moist. Good dentition. No  lesions. Neck:  Supple, without mass or thyromegaly. Heart:  S1, S2 present without murmurs, rubs, or gallops. Regular rate and rhythm. Abdomen:  +BS, soft, non-tender and non-distended. No rebound or guarding. No HSM or masses noted. Msk:  Symmetrical without gross deformities. Normal posture. Extremities:  Without edema. Neurologic:  Alert and  oriented x4;  grossly normal neurologically. Skin:  Intact without significant lesions or rashes. Cervical Nodes:  No significant cervical adenopathy. Psych:  Alert and cooperative. Normal mood and affect.  

## 2011-06-27 NOTE — Progress Notes (Signed)
Faxed to PCP

## 2011-06-27 NOTE — Assessment & Plan Note (Signed)
Chronic, negative [place], HIDA with falsely low EF per radiology. +pain with CCK infusion. Eating high fat meals at times, denies exacerbation with this. Not able to pinpoint precipitating factors. No other upper GI symptoms noted. Will proceed with TCS due to rectal bleeding and constipation. Consider elective referral to surgery if biliary component thought to be likely. Question constipation compounding symptoms.

## 2011-06-27 NOTE — Progress Notes (Signed)
Per Dr. Darrick Penna:  No breast feeding for 24 hours after her procedure. Needs to store breastmilk to cover this 24 hours, and she will pump/dump for the 24 hours following.

## 2011-06-28 NOTE — Progress Notes (Signed)
I got the note in reference to breast feeding. Tried to call pt. Many rings and no answer. Forwarded the note to [employer] for scheduling instructions.

## 2011-06-29 ENCOUNTER — Other Ambulatory Visit: Payer: Self-pay | Admitting: Gastroenterology

## 2011-06-29 ENCOUNTER — Encounter: Payer: Self-pay | Admitting: Gastroenterology

## 2011-06-29 DIAGNOSIS — K625 Hemorrhage of anus and rectum: Secondary | ICD-10-CM

## 2011-06-29 NOTE — Progress Notes (Signed)
SPOKE WITH PT-TCS SCHEDULED FOR 03/22- WENT OVER BREASTFEEDING INSTRUCTIONS- MAILED INSTRUCTIONS

## 2011-07-07 NOTE — Progress Notes (Signed)
REVIEWED.  

## 2011-07-10 ENCOUNTER — Other Ambulatory Visit: Payer: Self-pay | Admitting: Gastroenterology

## 2011-07-10 MED ORDER — PEG-KCL-NACL-NASULF-NA ASC-C 100 G PO SOLR: 1.0000 | Freq: Once | ORAL | Status: DC

## 2011-07-10 NOTE — Progress Notes (Signed)
  Per Dr. Darrick Penna, can use Fentanyl if pt wants. However, she will STILL need to pump and dump.

## 2011-07-18 ENCOUNTER — Encounter (HOSPITAL_COMMUNITY): Payer: Self-pay | Admitting: Pharmacy Technician

## 2011-07-20 MED ORDER — SODIUM CHLORIDE 0.45 % IV SOLN
Freq: Once | INTRAVENOUS | Status: AC
  Administered 2011-07-21: 12:00:00 via INTRAVENOUS

## 2011-07-21 ENCOUNTER — Encounter (HOSPITAL_COMMUNITY): Payer: Self-pay | Admitting: *Deleted

## 2011-07-21 ENCOUNTER — Ambulatory Visit (HOSPITAL_COMMUNITY)
Admission: RE | Admit: 2011-07-21 | Discharge: 2011-07-21 | Disposition: A | Discharge status: Home / Self Care | Payer: BC Managed Care – PPO | Source: Ambulatory Visit | Attending: Gastroenterology | Admitting: Gastroenterology

## 2011-07-21 ENCOUNTER — Encounter (HOSPITAL_COMMUNITY)
Admission: RE | Disposition: A | Discharge status: Home / Self Care | Payer: Self-pay | Source: Ambulatory Visit | Attending: Gastroenterology

## 2011-07-21 DIAGNOSIS — K5900 Constipation, unspecified: Secondary | ICD-10-CM

## 2011-07-21 DIAGNOSIS — K921 Melena: Secondary | ICD-10-CM | POA: Insufficient documentation

## 2011-07-21 DIAGNOSIS — K648 Other hemorrhoids: Secondary | ICD-10-CM

## 2011-07-21 DIAGNOSIS — K625 Hemorrhage of anus and rectum: Secondary | ICD-10-CM

## 2011-07-21 DIAGNOSIS — R109 Unspecified abdominal pain: Secondary | ICD-10-CM

## 2011-07-21 DIAGNOSIS — K5909 Other constipation: Secondary | ICD-10-CM | POA: Insufficient documentation

## 2011-07-21 HISTORY — PX: COLONOSCOPY: SHX5424

## 2011-07-21 LAB — PREGNANCY, URINE: Preg Test, Ur: NEGATIVE

## 2011-07-21 SURGERY — COLONOSCOPY
Anesthesia: Moderate Sedation

## 2011-07-21 MED ORDER — MEPERIDINE HCL 100 MG/ML IJ SOLN
INTRAMUSCULAR | Status: AC
  Filled 2011-07-21: qty 2

## 2011-07-21 MED ORDER — MIDAZOLAM HCL 5 MG/5ML IJ SOLN
INTRAMUSCULAR | Status: AC
  Filled 2011-07-21: qty 10

## 2011-07-21 MED ORDER — STERILE WATER FOR IRRIGATION IR SOLN
Status: DC | PRN
  Administered 2011-07-21: 13:00:00

## 2011-07-21 MED ORDER — MIDAZOLAM HCL 5 MG/5ML IJ SOLN
INTRAMUSCULAR | Status: DC | PRN
  Administered 2011-07-21 (×5): 2 mg via INTRAVENOUS

## 2011-07-21 MED ORDER — FENTANYL CITRATE 0.05 MG/ML IJ SOLN
INTRAMUSCULAR | Status: AC
  Filled 2011-07-21: qty 2

## 2011-07-21 MED ORDER — FENTANYL CITRATE 0.05 MG/ML IJ SOLN
INTRAMUSCULAR | Status: DC | PRN
  Administered 2011-07-21 (×6): 25 ug via INTRAVENOUS

## 2011-07-21 MED ORDER — PROMETHAZINE HCL 25 MG/ML IJ SOLN
INTRAMUSCULAR | Status: AC
  Filled 2011-07-21: qty 1

## 2011-07-21 MED ORDER — SODIUM CHLORIDE 0.9 % IJ SOLN
INTRAMUSCULAR | Status: AC
  Filled 2011-07-21: qty 10

## 2011-07-21 NOTE — Interval H&P Note (Signed)
History and Physical Interval Note:  07/21/2011 1:08 PM  Erika Johnston  has presented today for surgery, with the diagnosis of RECTAL BLEEDING  The various methods of treatment have been discussed with the patient and family. After consideration of risks, benefits and other options for treatment, the patient has consented to  Procedure(s) (LRB): COLONOSCOPY (N/A) as a surgical intervention .  The patients' history has been reviewed, patient examined, no change in status, stable for surgery.  I have reviewed the patients' chart and labs.  Questions were answered to the patient's satisfaction.     [name]  Pt states LACTATION CONSULTANT TOLD HER SHE CAN BREAST FEED AFTER 4 HOURS. Explained to pt we recommend mother pump and dump for 24 hours.

## 2011-07-21 NOTE — Interval H&P Note (Signed)
History and Physical Interval Note:  07/21/2011 12:56 PM  Erika Johnston  has presented today for surgery, with the diagnosis of RECTAL BLEEDING  The various methods of treatment have been discussed with the patient and family. After consideration of risks, benefits and other options for treatment, the patient has consented to  Procedure(s) (LRB): COLONOSCOPY (N/A) as a surgical intervention .  The patients' history has been reviewed, patient examined, no change in status, stable for surgery.  I have reviewed the patients' chart and labs.  Questions were answered to the patient's satisfaction.     [employer]

## 2011-07-21 NOTE — Discharge Instructions (Signed)
You have internal hemorrhoids. YOU DID NOT HAVE ANY POLYPS. YOU RECEIVED FENTANYL 150 MCG IV AND VERSED 10 MG IV.  TO AVOID CONSTIPATION: 1. FOLLOW A HIGH FIBER DIET. AVOID ITEMS THAT CAUSE BLOATING. SEE INFO BELOW. 2. USE METAMUCIL 1 OR 2 TIMES A DAY. 3. DRINK ENOUGH WATER TO KEEP URINE LIGHT YELLOW. 4. Add a PROBIOTIC DAILY([employer], [employer]ON HE[employer], OR ALIGN).  USE PREP H AS NEEDED FOR RECTAL PAIN OR DISCOMFORT. IF IT FAILS, I CAN PRESCRIBE PROCTO-CREAM.  RECOMMEND YOU AVOID BREAST FEEDING UNTIL 12n MAR 23. The half life of Fentanyl is 3 to 12 hours.   Colonoscopy Care After Read the instructions outlined below and refer to this sheet in the next week. These discharge instructions provide you with general information on caring for yourself after you leave the hospital. While your treatment has been planned according to the most current medical practices available, unavoidable complications occasionally occur. If you have any problems or questions after discharge, call DR. Annelyse Rey, [phone].  ACTIVITY  You may resume your regular activity, but move at a slower pace for the next 24 hours.   Take frequent rest periods for the next 24 hours.   Walking will help get rid of the air and reduce the bloated feeling in your belly (abdomen).   No driving for 24 hours (because of the medicine (anesthesia) used during the test).   You may shower.   Do not sign any important legal documents or operate any machinery for 24 hours (because of the anesthesia used during the test).    NUTRITION  Drink plenty of fluids.   You may resume your normal diet as instructed by your doctor.   Begin with a light meal and progress to your normal diet. Heavy or fried foods are harder to digest and may make you feel sick to your stomach (nauseated).   Avoid alcoholic beverages for 24 hours or as instructed.    MEDICATIONS  You may resume your normal medications.   WHAT YOU CAN  EXPECT TODAY  Some feelings of bloating in the abdomen.   Passage of more gas than usual.   Spotting of blood in your stool or on the toilet paper  .  IF YOU HAD POLYPS REMOVED DURING THE COLONOSCOPY:  Eat a soft diet IF YOU HAVE NAUSEA, BLOATING, ABDOMINAL PAIN, OR VOMITING.    FINDING OUT THE RESULTS OF YOUR TEST Not all test results are available during your visit. DR. Darrick Penna WILL CALL YOU WITHIN 7 DAYS OF YOUR PROCEDUE WITH YOUR RESULTS. Do not assume everything is normal if you have not heard from DR. Kaiyla Stahly IN ONE WEEK, CALL HER OFFICE AT [phone].  SEEK IMMEDIATE MEDICAL ATTENTION AND CALL THE OFFICE: [phone] IF:  You have more than a spotting of blood in your stool.   Your belly is swollen (abdominal distention).   You are nauseated or vomiting.   You have a temperature over 101F.   You have abdominal pain or discomfort that is severe or gets worse throughout the day.  High-Fiber Diet A high-fiber diet changes your normal diet to include more whole grains, legumes, fruits, and vegetables. Changes in the diet involve replacing refined carbohydrates with unrefined foods. The calorie level of the diet is essentially unchanged. The Dietary Reference Intake (recommended amount) for adult males is 38 grams per day. For adult females, it is 25 grams per day. Pregnant and lactating women should consume 28 grams of fiber per day. Fiber is the  intact part of a plant that is not broken down during digestion. Functional fiber is fiber that has been isolated from the plant to provide a beneficial effect in the body. PURPOSE  Increase stool bulk.   Ease and regulate bowel movements.   Lower cholesterol.  INDICATIONS THAT YOU NEED MORE FIBER  Constipation and hemorrhoids.   Uncomplicated diverticulosis (intestine condition) and irritable bowel syndrome.   Weight management.   As a protective measure against hardening of the arteries (atherosclerosis), diabetes, and  cancer.   GUIDELINES FOR INCREASING FIBER IN THE DIET  Start adding fiber to the diet slowly. A gradual increase of about 5 more grams (2 slices of whole-wheat bread, 2 servings of most fruits or vegetables, or 1 bowl of high-fiber cereal) per day is best. Too rapid an increase in fiber may result in constipation, flatulence, and bloating.   Drink enough water and fluids to keep your urine clear or pale yellow. Water, juice, or caffeine-free drinks are recommended. Not drinking enough fluid may cause constipation.   Eat a variety of high-fiber foods rather than one type of fiber.   Try to increase your intake of fiber through using high-fiber foods rather than fiber pills or supplements that contain small amounts of fiber.   The goal is to change the types of food eaten. Do not supplement your present diet with high-fiber foods, but replace foods in your present diet.  INCLUDE A VARIETY OF FIBER SOURCES  Replace refined and processed grains with whole grains, canned fruits with fresh fruits, and incorporate other fiber sources. White rice, white breads, and most bakery goods contain little or no fiber.   Brown whole-grain rice, buckwheat oats, and many fruits and vegetables are all good sources of fiber. These include: broccoli, Brussels sprouts, cabbage, cauliflower, beets, sweet potatoes, white potatoes (skin on), carrots, tomatoes, eggplant, squash, berries, fresh fruits, and dried fruits.   Cereals appear to be the richest source of fiber. Cereal fiber is found in whole grains and bran. Bran is the fiber-rich outer coat of cereal grain, which is largely removed in refining. In whole-grain cereals, the bran remains. In breakfast cereals, the largest amount of fiber is found in those with "bran" in their names. The fiber content is sometimes indicated on the label.   You may need to include additional fruits and vegetables each day.   In baking, for 1 cup white flour, you may use the  following substitutions:   1 cup whole-wheat flour minus 2 tablespoons.   1/2 cup white flour plus 1/2 cup whole-wheat flour.   Hemorrhoids Hemorrhoids are dilated (enlarged) veins around the rectum. Sometimes clots will form in the veins. This makes them swollen and painful. These are called thrombosed hemorrhoids. Causes of hemorrhoids include:  Constipation.   Straining to have a bowel movement.   HEAVY LIFTING HOME CARE INSTRUCTIONS  Eat a well balanced diet and drink 6 to 8 glasses of water every day to avoid constipation. You may also use a bulk laxative.   Avoid straining to have bowel movements.   Keep anal area dry and clean.   Do not use a donut shaped pillow or sit on the toilet for long periods. This increases blood pooling and pain.   Move your bowels when your body has the urge; this will require less straining and will decrease pain and pressure.

## 2011-07-21 NOTE — H&P (View-Only) (Signed)
Referring Provider: Lazaro Arms, MD Primary Care Physician:  No primary provider on file. Primary Gastroenterologist: Dr. Darrick Penna   Chief Complaint  Patient presents with  . Follow-up    HPI:   Erika Johnston is a 26 year old female who presents today in follow-up for abdominal pain, specifically RUQ and constipation. She has a baby girl who is still breastfeeding. Was set up for [place] of abd and subsequently HIDA at last appt due to concerns for biliary etiology. Also started on Miralax due to constipation. Hx of rectal bleeding and reports hemorrhoids in past. HFP normal in Feb 2013.  Returns today with continued RUQ/right-sided intermittent abdominal discomfort without precipitating factors. Feels like a pressure, "hard to describe". Denies reflux, N/V. Pain not worsened with eating. Eats high fat foods at time without any aggravation.   Has not had to take Miralax for constipation. States fiber bars helping significantly. Anusol suppositories helped, but she did not take as directed. Sometimes uncomfortable with bowel movements, especially if large.    [place] of abd: IMPRESSION:  Slightly prominent hepatic size suggested sonographically with no  focal or diffuse hepatic abnormality otherwise identified.  Otherwise unremarkable abdominal ultrasound  HIDA: IMPRESSION:  Normal hepatobiliary scan, with a falsely low ejection fraction  Study. + pain during CCK infusion.     Past Medical History  Diagnosis Date  . No pertinent past medical history     Past Surgical History  Procedure Date  . No past surgeries     Current Outpatient Prescriptions  Medication Sig Dispense Refill  . acetaminophen (TYLENOL) 500 MG tablet Take 1,000 mg by mouth every 6 (six) hours as needed. Patient was using this medication for a headache.       Marland Kitchen amoxicillin (AMOXIL) 500 MG capsule Take 500 mg by mouth 3 (three) times daily.       . folic acid (FOLVITE) 800 MCG tablet Take 400 mcg by mouth daily.        .  Prenat-FeFum-FePo-FA-Omega 3 (CONCEPT DHA PO) Take 1 tablet by mouth daily.        . Prenatal Vit-Fe Fumarate-FA (PRENATAL MULTIVITAMIN) TABS Take 1 tablet by mouth daily.      . hydrocortisone (ANUSOL-HC) 25 MG suppository Place 1 suppository (25 mg total) rectally every 12 (twelve) hours. For 10 days.  20 suppository  0    Allergies as of 06/26/2011  . (No Known Allergies)    Family History  Problem Relation Age of Onset  . Anesthesia problems Neg Hx   . Hypotension Neg Hx   . Malignant hyperthermia Neg Hx   . Pseudochol deficiency Neg Hx   . Cancer Maternal Grandmother   . Colon cancer Neg Hx     History   Social History  . Marital Status: Married    Spouse Name: N/A    Number of Children: N/A  . Years of Education: N/A   Social History Main Topics  . Smoking status: Never Smoker   . Smokeless tobacco: None  . Alcohol Use: No  . Drug Use: No  . Sexually Active: Yes   Other Topics Concern  . None   Social History Narrative  . None    Review of Systems: Gen: Denies fever, chills, anorexia. Denies fatigue, weakness, weight loss.  CV: Denies chest pain, palpitations, syncope, peripheral edema, and claudication. Resp: Denies dyspnea at rest, cough, wheezing, coughing up blood, and pleurisy. GI: Denies vomiting blood, jaundice, and fecal incontinence.   Denies dysphagia or odynophagia. Derm: Denies  rash, itching, dry skin Psych: Denies depression, anxiety, memory loss, confusion. No homicidal or suicidal ideation.  Heme: Denies bruising, bleeding, and enlarged lymph nodes.  Physical Exam: BP 118/71  Pulse 80  Temp(Src) 98.1 F (36.7 C) (Temporal)  Ht 5\' 5"  (1.651 m)  Wt 217 lb 9.6 oz (98.703 kg)  BMI 36.21 kg/m2  Breastfeeding? Yes General:   Alert and oriented. No distress noted. Pleasant and cooperative.  Head:  Normocephalic and atraumatic. Eyes:  Conjuctiva clear without scleral icterus. Mouth:  Oral mucosa pink and moist. Good dentition. No  lesions. Neck:  Supple, without mass or thyromegaly. Heart:  S1, S2 present without murmurs, rubs, or gallops. Regular rate and rhythm. Abdomen:  +BS, soft, non-tender and non-distended. No rebound or guarding. No HSM or masses noted. Msk:  Symmetrical without gross deformities. Normal posture. Extremities:  Without edema. Neurologic:  Alert and  oriented x4;  grossly normal neurologically. Skin:  Intact without significant lesions or rashes. Cervical Nodes:  No significant cervical adenopathy. Psych:  Alert and cooperative. Normal mood and affect.

## 2011-07-21 NOTE — H&P (View-Only) (Signed)
REVIEWED.  

## 2011-07-25 NOTE — Op Note (Signed)
[facility] [address] [place], [place]  [address]  COLONOSCOPY PROCEDURE REPORT  PATIENT:  Erika, Johnston  MR#:  [id number] BIRTHDATE:  02-12-1986, 25 yrs. old  GENDER:  female  ENDOSCOPIST:  Jonette Eva, MD REF. BY:  Duane Lope, M.D. ASSISTANT:  PROCEDURE DATE:  07/21/2011 PROCEDURE:  ILEOColonoscopy 19147  INDICATIONS:  ABDOMINAL PAIN, CONSTIPATION, RECTAL BLEEDING PT UNBLE TO TOLERATE MOVIPREP DUE TO SEVER NAUSEA. USED MIRALAX PREP FOR 6 HOURS.  MEDICATIONS:   Fentanyl 150 mcg IV, Versed 10 mg IV  DESCRIPTION OF PROCEDURE:    Physical exam was performed. Informed consent was obtained from the patient after explaining the benefits, risks, and alternatives to procedure.  The patient was connected to monitor and placed in left lateral position. Continuous oxygen was provided by nasal cannula and IV medicine administered through an indwelling cannula.  After administration of sedation and rectal exam, the patient's rectum was intubated and the EC-[id number] ([id number]) colonoscope was advanced under direct visualization to the ILEUM.  The scope was removed slowly by carefully examining the color, texture, anatomy, and integrity mucosa on the way out.  The patient was recovered in endoscopy and discharged home in satisfactory condition. <<PROCEDUREIMAGES>>  FINDINGS:  Normal colonoscopy without polyps, masses, vascular ectasias, or inflammatory changes.  NL ILEUM. SMALL Internal Hemorrhoids were found.  PREP QUALITY:  EXCELLENT CECAL W/D TIME:    10 minutes  COMPLICATIONS:    None  ENDOSCOPIC IMPRESSION: 1) Internal hemorrhoids: CAUSING RECTAL BLEEDING 2) Normal colonoscopy 3) Normal terminal ileum 4) ABDOMINAL PAIN MOST LIKELY DUE TO IBS-C  RECOMMENDATIONS: HIGH FIBER DIET PROBIOTIC DAILY PREP H PRN METAMUCIL 1-2 TIMES A DAY DRINK WATER OPV IN 3 MOS MAY BREASTFEED AFTER 12N MAR 23 NEXT ENDOSCOPY WITH PRORPOFOL  REPEAT EXAM:   No  ______________________________ Jonette Eva, MD  CC:  Duane Lope, M.D.  n. eSIGNED:   Ruie Sendejo at 07/25/2011 03:13 PM  Ulanda Edison, [phone]

## 2011-07-26 ENCOUNTER — Encounter (HOSPITAL_COMMUNITY): Payer: Self-pay | Admitting: Gastroenterology

## 2011-08-17 LAB — COMPREHENSIVE METABOLIC PANEL
AST: 21 U/L
Alkaline Phosphatase: 103 U/L
BUN: 14 mg/dL (ref 4–21)
Creat: 0.75
Potassium: 4.1 mmol/L

## 2011-08-17 LAB — CBC WITH DIFFERENTIAL/PLATELET
HCT: 40 %
MCV: 93.7 fL
Rhuematoid fact SerPl-aCnc: <10
WBC: 8.1
platelet count: 223

## 2011-08-21 ENCOUNTER — Other Ambulatory Visit (HOSPITAL_COMMUNITY): Payer: Self-pay | Admitting: Internal Medicine

## 2011-08-21 DIAGNOSIS — N83209 Unspecified ovarian cyst, unspecified side: Secondary | ICD-10-CM

## 2011-08-24 ENCOUNTER — Other Ambulatory Visit (HOSPITAL_COMMUNITY): Payer: BC Managed Care – PPO

## 2011-08-25 ENCOUNTER — Ambulatory Visit (HOSPITAL_COMMUNITY)
Admission: RE | Admit: 2011-08-25 | Discharge: 2011-08-25 | Disposition: A | Discharge status: Home / Self Care | Payer: BC Managed Care – PPO | Source: Ambulatory Visit | Attending: Internal Medicine | Admitting: Internal Medicine

## 2011-08-25 ENCOUNTER — Other Ambulatory Visit (HOSPITAL_COMMUNITY): Payer: Self-pay | Admitting: Internal Medicine

## 2011-08-25 DIAGNOSIS — N949 Unspecified condition associated with female genital organs and menstrual cycle: Secondary | ICD-10-CM | POA: Insufficient documentation

## 2011-08-25 DIAGNOSIS — R1031 Right lower quadrant pain: Secondary | ICD-10-CM | POA: Insufficient documentation

## 2011-08-25 DIAGNOSIS — N83209 Unspecified ovarian cyst, unspecified side: Secondary | ICD-10-CM

## 2011-08-29 ENCOUNTER — Encounter: Payer: Self-pay | Admitting: Gastroenterology

## 2011-08-29 NOTE — Progress Notes (Unsigned)
Needs appt in 3 mos (around June/July) for routine follow-up of IBS-C, RUQ pain.

## 2011-09-21 ENCOUNTER — Encounter: Payer: Self-pay | Admitting: Gastroenterology

## 2011-09-21 NOTE — Progress Notes (Signed)
Pt is aware of OV on 6/24 @ 10 with LSL and appt card was mailed

## 2011-10-17 ENCOUNTER — Encounter: Payer: Self-pay | Admitting: Gastroenterology

## 2011-10-23 ENCOUNTER — Ambulatory Visit: Payer: BC Managed Care – PPO | Admitting: Gastroenterology

## 2011-11-07 ENCOUNTER — Encounter: Payer: Self-pay | Admitting: Gastroenterology

## 2011-11-07 ENCOUNTER — Ambulatory Visit (INDEPENDENT_AMBULATORY_CARE_PROVIDER_SITE_OTHER): Payer: BC Managed Care – PPO | Admitting: Gastroenterology

## 2011-11-07 VITALS — BP 115/77 | HR 71 | Temp 98.1°F | Ht 65.0 in | Wt 214.0 lb

## 2011-11-07 DIAGNOSIS — R1011 Right upper quadrant pain: Secondary | ICD-10-CM

## 2011-11-07 DIAGNOSIS — K649 Unspecified hemorrhoids: Secondary | ICD-10-CM | POA: Insufficient documentation

## 2011-11-07 DIAGNOSIS — K5900 Constipation, unspecified: Secondary | ICD-10-CM

## 2011-11-07 DIAGNOSIS — R1031 Right lower quadrant pain: Secondary | ICD-10-CM

## 2011-11-07 NOTE — Patient Instructions (Addendum)
We will request copy of your labs from Dr. Lamar Blinks office. Take Miralax 17g at bedtime on days you do not have a bowel movement.  If your right upper abdominal pain becomes more frequent, please let us or your PCP know.  Please see your PCP regarding right hip pain.

## 2011-11-07 NOTE — Progress Notes (Signed)
Primary Care Physician: Colette Ribas, MD  Primary Gastroenterologist:  Jonette Eva, MD   Chief Complaint  Patient presents with  . Follow-up    HPI: Erika Johnston is a 26 y.o. female here for f/u abdominal pain. Had colonoscopy in 06/2011. Noted to have internal hemorrhoids likely source of bleeding. Last OV 06/2011. H/O pp RUQ pain to certain foods, especially pasta. Previous abd u/s showed normal gb. HIDA with falsely lowe EF and some pain during CCK. Patient states she manages her RUQ pain with food avoidance. She is still concerned about a constant pain over her right hip. Worse with movement. She reports seeing multiple doctors without clear diagnosis. She has had several pelvic/transvaginal u/s. No significant findings. She has not menstruated, daughter is 84 months old and she is breastfed. She is worried about lump above her right breast but has seen her gynecologist and reports there is no concern. BMs doing okay right now. Takes probiotic several times per week. If takes daily, she gets diarrhea. No further rectal bleeding.    Past Surgical History  Procedure Date  . No past surgeries   . Colonoscopy 07/21/2011    Internal hemorrhoids: CAUSING RECTAL BLEEDING/ Normal colonoscopy/ Normal terminal ileum/ ABDOMINAL PAIN MOST LIKELY DUE TO IBS-C     Current Outpatient Prescriptions  Medication Sig Dispense Refill  . folic acid (FOLVITE) 800 MCG tablet Take 400 mcg by mouth daily.        Marland Kitchen ibuprofen (ADVIL,MOTRIN) 200 MG tablet Take 400 mg by mouth every 6 (six) hours as needed. For headache/pain      . Prenatal Vit-Fe Fumarate-FA (PRENATAL MULTIVITAMIN) TABS Take 1 tablet by mouth daily.      . Probiotic Product (PROBIOTIC DAILY PO) Take by mouth daily.        Allergies as of 11/07/2011  . (No Known Allergies)    ROS:  General: Negative for anorexia, weight loss, fever, chills, fatigue, weakness. ENT: Negative for hoarseness, difficulty swallowing , nasal  congestion. CV: Negative for chest pain, angina, palpitations, dyspnea on exertion, peripheral edema.  Respiratory: Negative for dyspnea at rest, dyspnea on exertion, cough, sputum, wheezing.  GI: See history of present illness. GU:  Negative for dysuria, hematuria, urinary incontinence, urinary frequency, nocturnal urination.  Endo: Negative for unusual weight change.    Physical Examination:   BP 115/77  Pulse 71  Temp 98.1 F (36.7 C) (Temporal)  Ht 5\' 5"  (1.651 m)  Wt 214 lb (97.07 kg)  BMI 35.61 kg/m2  Breastfeeding? Yes  General: Well-nourished, well-developed in no acute distress.  Eyes: No icterus. Mouth: Oropharyngeal mucosa moist and pink , no lesions erythema or exudate. Lungs: Clear to auscultation bilaterally.  BREAST: examination of chest above right breast was normal. Patient located area of concern for lump but none appreciated by me today. Heart: Regular rate and rhythm, no murmurs rubs or gallops.  Abdomen: Bowel sounds are normal, nontender, nondistended, no hepatosplenomegaly or masses, no abdominal bruits or hernia , no rebound or guarding.   Extremities: No lower extremity edema. No clubbing or deformities. Tenderness over right hip. Exacerbated by straight leg raise. No CVA tenderness or spinal point tenderness. Neuro: Alert and oriented x 4   Skin: Warm and dry, no jaundice.   Psych: Alert and cooperative, normal mood and affect.  Labs:  Patient reports numerous labs done at pcp, we have requested results.   Imaging Studies: No results found.

## 2011-11-09 NOTE — Assessment & Plan Note (Signed)
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 Patient does well with food avoidance. Recommended if she has more frequent ruq pain to consider surgical consultation.

## 2011-11-09 NOTE — Progress Notes (Signed)
Faxed to PCP

## 2011-11-09 NOTE — Assessment & Plan Note (Addendum)
Her pain is constant and located over the hip. Present since vaginal delivery of her second child 7 months ago. ?musculoskeletal component. I encouraged her to discuss further with PCP. Consider plain hip/back film to look for evidence of nerve impingement or other abnormality. I will review her labs done by PCP per her request. Consider ESR if not done given patient's numerous complaints of various pains, swelling/lumps.

## 2011-11-09 NOTE — Assessment & Plan Note (Addendum)
Constipation better with probiotic. No further bleeding. Hemorrhoids not bothering her at this point. Patient inquired about hemorrhoid banding. Recommended she continue prn creams as needed given size of hemorrhoids and her desire to have more children. Take miralax 17g at bedtime on days she does not have soft stool.

## 2011-11-13 ENCOUNTER — Telehealth: Payer: Self-pay | Admitting: Gastroenterology

## 2011-11-13 NOTE — Telephone Encounter (Signed)
LMOM that we do not have these labs yet.

## 2011-11-13 NOTE — Telephone Encounter (Signed)
Pt was calling to see if we were able to get her Sed Rate labs from [place] sent to [place]. She said LSL was wanting a copy or she would have to order them on patient. Pt can be reached at [phone]

## 2011-11-13 NOTE — Telephone Encounter (Signed)
Erika Johnston received, and placed on [employer] for tomorrow.

## 2011-11-13 NOTE — Telephone Encounter (Signed)
I requested labs from [facility] to be faxed.

## 2011-11-14 NOTE — Telephone Encounter (Signed)
LMOM to call back

## 2011-11-14 NOTE — Progress Notes (Signed)
Received labs from PCP dated 08/17/2011. Sodium 140, potassium 4.1, glucose 85, BUN 14, creatinine 0.75, total bilirubin 0.2, alkaline phosphatase 183, AST 21, ALT 31, albumin 4.8, calcium 9.6, white blood cell count 8100, hemoglobin 13.6, hematocrit 40.4, MCV 93.7, platelets 223,000, sedimentation rate 8, vitamin B12 555, rheumatoid factor less than 10, ANA negative, vitamin D 48.  Please let patient know, her labs look great. Sed rate normal. I would recommend she address her chronic hip pain with PCP as spoke with her at OV. ?injury related to vaginal delivery.

## 2011-11-14 NOTE — Telephone Encounter (Signed)
Labs reviewed. See addendum to OV note dated 11/07/11.

## 2011-11-15 NOTE — Telephone Encounter (Signed)
Called and informed pt.  

## 2011-11-16 NOTE — Progress Notes (Signed)
Pt is aware.  

## 2011-12-20 NOTE — Progress Notes (Signed)
REVIEWED.  

## 2012-05-26 ENCOUNTER — Emergency Department (HOSPITAL_COMMUNITY)
Admission: EM | Admit: 2012-05-26 | Discharge: 2012-05-27 | Disposition: A | Discharge status: Home / Self Care | Payer: BC Managed Care – PPO | Attending: Emergency Medicine | Admitting: Emergency Medicine

## 2012-05-26 ENCOUNTER — Encounter (HOSPITAL_COMMUNITY): Payer: Self-pay | Admitting: *Deleted

## 2012-05-26 DIAGNOSIS — R52 Pain, unspecified: Secondary | ICD-10-CM | POA: Insufficient documentation

## 2012-05-26 DIAGNOSIS — R05 Cough: Secondary | ICD-10-CM | POA: Insufficient documentation

## 2012-05-26 DIAGNOSIS — R5381 Other malaise: Secondary | ICD-10-CM | POA: Insufficient documentation

## 2012-05-26 DIAGNOSIS — R6889 Other general symptoms and signs: Secondary | ICD-10-CM

## 2012-05-26 DIAGNOSIS — Z79899 Other long term (current) drug therapy: Secondary | ICD-10-CM | POA: Insufficient documentation

## 2012-05-26 DIAGNOSIS — R509 Fever, unspecified: Secondary | ICD-10-CM | POA: Insufficient documentation

## 2012-05-26 DIAGNOSIS — R059 Cough, unspecified: Secondary | ICD-10-CM | POA: Insufficient documentation

## 2012-05-26 DIAGNOSIS — IMO0001 Reserved for inherently not codable concepts without codable children: Secondary | ICD-10-CM | POA: Insufficient documentation

## 2012-05-26 MED ORDER — IBUPROFEN 800 MG PO TABS
800.0000 mg | ORAL_TABLET | Freq: Once | ORAL | Status: AC
  Administered 2012-05-27: 800 mg via ORAL
  Filled 2012-05-26: qty 1

## 2012-05-26 NOTE — ED Provider Notes (Signed)
History   This chart was scribed for Erika Gaskins, MD by Leone Payor, ED Scribe. This patient was seen in room APA06/APA06 and the patient's care was started 11:42 PM.   CSN: [id number]  Arrival date & time 05/26/12  2305   First MD Initiated Contact with Patient 05/26/12 2325      Chief Complaint  Patient presents with  . Generalized Body Aches    Patient is a 27 y.o. female presenting with flu symptoms and fever. The history is provided by the patient. No language interpreter was used.  Influenza This is a new problem. The current episode started 12 to 24 hours ago. The problem occurs constantly. The problem has not changed since onset.Nothing aggravates the symptoms. Nothing relieves the symptoms.  Fever Primary symptoms of the febrile illness include fever, cough and myalgias. Primary symptoms do not include vomiting or diarrhea. The current episode started today. This is a new problem. The problem has not changed since onset. The fever began today. The fever has been unchanged since its onset. The maximum temperature recorded prior to her arrival was 103 to 104 F.  The cough began today. The cough is new.  Myalgias began today. The myalgias have been unchanged since their onset. The myalgias are generalized. The myalgias are aching. The discomfort from the myalgias is moderate. The myalgias are associated with weakness.   HPI Comments: Pt does not have any pertinent medical history.  Pt denies smoking and alcohol use.   Past Medical History  Diagnosis Date  . No pertinent past medical history     Past Surgical History  Procedure Date  . No past surgeries   . Colonoscopy 07/21/2011    Internal hemorrhoids: CAUSING RECTAL BLEEDING/ Normal colonoscopy/ Normal terminal ileum/ ABDOMINAL PAIN MOST LIKELY DUE TO IBS-C    Family History  Problem Relation Age of Onset  . Anesthesia problems Neg Hx   . Hypotension Neg Hx   . Malignant hyperthermia Neg Hx   . Pseudochol  deficiency Neg Hx   . Cancer Maternal Grandmother   . Colon cancer Neg Hx     History  Substance Use Topics  . Smoking status: Never Smoker   . Smokeless tobacco: Not on file  . Alcohol Use: No    OB History    Grav Para Term Preterm Abortions TAB SAB Ect Mult Living   4 2 2  0 2 0 2 0 0 2      Review of Systems  Constitutional: Positive for fever.  Respiratory: Positive for cough.   Gastrointestinal: Negative for vomiting and diarrhea.  Musculoskeletal: Positive for myalgias. Negative for back pain.  Neurological: Positive for weakness.  All other systems reviewed and are negative.    Allergies  Review of patient's allergies indicates no known allergies.  Home Medications   Current Outpatient Rx  Name  Route  Sig  Dispense  Refill  . FOLIC ACID 800 MCG PO TABS   Oral   Take 400 mcg by mouth daily.           . IBUPROFEN 200 MG PO TABS   Oral   Take 400 mg by mouth every 6 (six) hours as needed. For headache/pain         . PRENATAL MULTIVITAMIN CH   Oral   Take 1 tablet by mouth daily.         Marland Kitchen PROBIOTIC DAILY PO   Oral   Take by mouth daily.  BP 108/70  Pulse 108  Temp 100.5 F (38.1 C) (Oral)  Resp 20  Ht 5\' 5"  (1.651 m)  Wt 218 lb 9.6 oz (99.156 kg)  BMI 36.38 kg/m2  SpO2 96%  Physical Exam  CONSTITUTIONAL: Well developed/well nourished HEAD AND FACE: Normocephalic/atraumatic EYES: EOMI/PERRL ENMT: Mucous membranes moist NECK: supple no meningeal signs CV: S1/S2 noted, no murmurs/rubs/gallops noted LUNGS: Lungs are clear to auscultation bilaterally, no apparent distress ABDOMEN: soft, nontender, no rebound or guarding GU:no cva tenderness NEURO: Pt is awake/alert, moves all extremitiesx4 EXTREMITIES: pulses normal, full ROM SKIN: warm, color normal PSYCH: no abnormalities of mood noted   ED Course  Procedures (including critical care time)  DIAGNOSTIC STUDIES: None performed.     COORDINATION OF CARE:  11:40 PM  Discussed treatment plan which includes tylenol, ibuprofen with pt at bedside and pt agreed to plan.    Pt well appearing, no distress, HR improved here.  Suspect flu like illness, defer further workup at this time  MDM  Nursing notes including past medical history and social history reviewed and considered in documentation .      I personally performed the services described in this documentation, which was scribed in my presence. The recorded information has been reviewed and is accurate.    Erika Gaskins, MD 05/27/12 [phone]

## 2012-05-26 NOTE — ED Notes (Signed)
Pt c/o fever and body aches. Pt states she had a fever of 104 earlier today.

## 2012-05-27 NOTE — ED Notes (Signed)
Pt anxious because she is worried about her daughter. Pt drinking fluids in room.

## 2012-06-19 ENCOUNTER — Other Ambulatory Visit (HOSPITAL_COMMUNITY): Payer: Self-pay | Admitting: Internal Medicine

## 2012-06-19 ENCOUNTER — Ambulatory Visit (HOSPITAL_COMMUNITY)
Admission: RE | Admit: 2012-06-19 | Discharge: 2012-06-19 | Disposition: A | Discharge status: Home / Self Care | Payer: BC Managed Care – PPO | Source: Ambulatory Visit | Attending: Internal Medicine | Admitting: Internal Medicine

## 2012-06-19 DIAGNOSIS — M76899 Other specified enthesopathies of unspecified lower limb, excluding foot: Secondary | ICD-10-CM

## 2012-06-19 DIAGNOSIS — M25559 Pain in unspecified hip: Secondary | ICD-10-CM | POA: Insufficient documentation

## 2012-07-04 ENCOUNTER — Ambulatory Visit (INDEPENDENT_AMBULATORY_CARE_PROVIDER_SITE_OTHER): Payer: BC Managed Care – PPO | Admitting: Otolaryngology

## 2012-07-04 DIAGNOSIS — R220 Localized swelling, mass and lump, head: Secondary | ICD-10-CM

## 2012-07-09 ENCOUNTER — Other Ambulatory Visit (INDEPENDENT_AMBULATORY_CARE_PROVIDER_SITE_OTHER): Payer: Self-pay | Admitting: Otolaryngology

## 2012-07-09 DIAGNOSIS — R221 Localized swelling, mass and lump, neck: Secondary | ICD-10-CM

## 2012-07-11 ENCOUNTER — Ambulatory Visit (HOSPITAL_COMMUNITY): Payer: BC Managed Care – PPO

## 2012-07-19 ENCOUNTER — Ambulatory Visit (HOSPITAL_COMMUNITY)
Admission: RE | Admit: 2012-07-19 | Discharge: 2012-07-19 | Disposition: A | Discharge status: Home / Self Care | Payer: BC Managed Care – PPO | Source: Ambulatory Visit | Attending: Otolaryngology | Admitting: Otolaryngology

## 2012-07-19 DIAGNOSIS — R220 Localized swelling, mass and lump, head: Secondary | ICD-10-CM | POA: Insufficient documentation

## 2012-07-19 DIAGNOSIS — R221 Localized swelling, mass and lump, neck: Secondary | ICD-10-CM

## 2012-07-19 MED ORDER — IOHEXOL 300 MG/ML  SOLN
75.0000 mL | Freq: Once | INTRAMUSCULAR | Status: AC | PRN
  Administered 2012-07-19: 75 mL via INTRAVENOUS

## 2013-09-29 ENCOUNTER — Ambulatory Visit: Payer: BC Managed Care – PPO | Admitting: Gastroenterology

## 2014-11-25 ENCOUNTER — Encounter: Payer: Self-pay | Admitting: Adult Health

## 2014-11-25 ENCOUNTER — Ambulatory Visit (INDEPENDENT_AMBULATORY_CARE_PROVIDER_SITE_OTHER): Payer: BLUE CROSS/BLUE SHIELD | Admitting: Adult Health

## 2014-11-25 VITALS — BP 132/80 | HR 80 | Ht 65.0 in | Wt 199.0 lb

## 2014-11-25 DIAGNOSIS — O3680X0 Pregnancy with inconclusive fetal viability, not applicable or unspecified: Secondary | ICD-10-CM

## 2014-11-25 DIAGNOSIS — Z3200 Encounter for pregnancy test, result unknown: Secondary | ICD-10-CM

## 2014-11-25 DIAGNOSIS — Z3201 Encounter for pregnancy test, result positive: Secondary | ICD-10-CM

## 2014-11-25 DIAGNOSIS — Z3490 Encounter for supervision of normal pregnancy, unspecified, unspecified trimester: Secondary | ICD-10-CM

## 2014-11-25 HISTORY — DX: Encounter for supervision of normal pregnancy, unspecified, unspecified trimester: Z34.90

## 2014-11-25 LAB — POCT URINE PREGNANCY: PREG TEST UR: POSITIVE — AB

## 2014-11-25 NOTE — Patient Instructions (Signed)
First Trimester of Pregnancy The first trimester of pregnancy is from week 1 until the end of week 12 (months 1 through 3). A week after a sperm fertilizes an egg, the egg will implant on the wall of the uterus. This embryo will begin to develop into a baby. Genes from you and your partner are forming the baby. The female genes determine whether the baby is a boy or a girl. At 6-8 weeks, the eyes and face are formed, and the heartbeat can be seen on ultrasound. At the end of 12 weeks, all the baby's organs are formed.  Now that you are pregnant, you will want to do everything you can to have a healthy baby. Two of the most important things are to get good prenatal care and to follow your health care provider's instructions. Prenatal care is all the medical care you receive before the baby's birth. This care will help prevent, find, and treat any problems during the pregnancy and childbirth. BODY CHANGES Your body goes through many changes during pregnancy. The changes vary from woman to woman.   You may gain or lose a couple of pounds at first.  You may feel sick to your stomach (nauseous) and throw up (vomit). If the vomiting is uncontrollable, call your health care provider.  You may tire easily.  You may develop headaches that can be relieved by medicines approved by your health care provider.  You may urinate more often. Painful urination may mean you have a bladder infection.  You may develop heartburn as a result of your pregnancy.  You may develop constipation because certain hormones are causing the muscles that push waste through your intestines to slow down.  You may develop hemorrhoids or swollen, bulging veins (varicose veins).  Your breasts may begin to grow larger and become tender. Your nipples may stick out more, and the tissue that surrounds them (areola) may become darker.  Your gums may bleed and may be sensitive to brushing and flossing.  Dark spots or blotches (chloasma,  mask of pregnancy) may develop on your face. This will likely fade after the baby is born.  Your menstrual periods will stop.  You may have a loss of appetite.  You may develop cravings for certain kinds of food.  You may have changes in your emotions from day to day, such as being excited to be pregnant or being concerned that something may go wrong with the pregnancy and baby.  You may have more vivid and strange dreams.  You may have changes in your hair. These can include thickening of your hair, rapid growth, and changes in texture. Some women also have hair loss during or after pregnancy, or hair that feels dry or thin. Your hair will most likely return to normal after your baby is born. WHAT TO EXPECT AT YOUR PRENATAL VISITS During a routine prenatal visit:  You will be weighed to make sure you and the baby are growing normally.  Your blood pressure will be taken.  Your abdomen will be measured to track your baby's growth.  The fetal heartbeat will be listened to starting around week 10 or 12 of your pregnancy.  Test results from any previous visits will be discussed. Your health care provider may ask you:  How you are feeling.  If you are feeling the baby move.  If you have had any abnormal symptoms, such as leaking fluid, bleeding, severe headaches, or abdominal cramping.  If you have any questions. Other tests   that may be performed during your first trimester include:  Blood tests to find your blood type and to check for the presence of any previous infections. They will also be used to check for low iron levels (anemia) and Rh antibodies. Later in the pregnancy, blood tests for diabetes will be done along with other tests if problems develop.  Urine tests to check for infections, diabetes, or protein in the urine.  An ultrasound to confirm the proper growth and development of the baby.  An amniocentesis to check for possible genetic problems.  Fetal screens for  spina bifida and Down syndrome.  You may need other tests to make sure you and the baby are doing well. HOME CARE INSTRUCTIONS  Medicines  Follow your health care provider's instructions regarding medicine use. Specific medicines may be either safe or unsafe to take during pregnancy.  Take your prenatal vitamins as directed.  If you develop constipation, try taking a stool softener if your health care provider approves. Diet  Eat regular, well-balanced meals. Choose a variety of foods, such as meat or vegetable-based protein, fish, milk and low-fat dairy products, vegetables, fruits, and whole grain breads and cereals. Your health care provider will help you determine the amount of weight gain that is right for you.  Avoid raw meat and uncooked cheese. These carry germs that can cause birth defects in the baby.  Eating four or five small meals rather than three large meals a day may help relieve nausea and vomiting. If you start to feel nauseous, eating a few soda crackers can be helpful. Drinking liquids between meals instead of during meals also seems to help nausea and vomiting.  If you develop constipation, eat more high-fiber foods, such as fresh vegetables or fruit and whole grains. Drink enough fluids to keep your urine clear or pale yellow. Activity and Exercise  Exercise only as directed by your health care provider. Exercising will help you:  Control your weight.  Stay in shape.  Be prepared for labor and delivery.  Experiencing pain or cramping in the lower abdomen or low back is a good sign that you should stop exercising. Check with your health care provider before continuing normal exercises.  Try to avoid standing for long periods of time. Move your legs often if you must stand in one place for a long time.  Avoid heavy lifting.  Wear low-heeled shoes, and practice good posture.  You may continue to have sex unless your health care provider directs you  otherwise. Relief of Pain or Discomfort  Wear a good support bra for breast tenderness.   Take warm sitz baths to soothe any pain or discomfort caused by hemorrhoids. Use hemorrhoid cream if your health care provider approves.   Rest with your legs elevated if you have leg cramps or low back pain.  If you develop varicose veins in your legs, wear support hose. Elevate your feet for 15 minutes, 3-4 times a day. Limit salt in your diet. Prenatal Care  Schedule your prenatal visits by the twelfth week of pregnancy. They are usually scheduled monthly at first, then more often in the last 2 months before delivery.  Write down your questions. Take them to your prenatal visits.  Keep all your prenatal visits as directed by your health care provider. Safety  Wear your seat belt at all times when driving.  Make a list of emergency phone numbers, including numbers for family, friends, the hospital, and police and fire departments. General Tips    Ask your health care provider for a referral to a local prenatal education class. Begin classes no later than at the beginning of month 6 of your pregnancy.  Ask for help if you have counseling or nutritional needs during pregnancy. Your health care provider can offer advice or refer you to specialists for help with various needs.  Do not use hot tubs, steam rooms, or saunas.  Do not douche or use tampons or scented sanitary pads.  Do not cross your legs for long periods of time.  Avoid cat litter boxes and soil used by cats. These carry germs that can cause birth defects in the baby and possibly loss of the fetus by miscarriage or stillbirth.  Avoid all smoking, herbs, alcohol, and medicines not prescribed by your health care provider. Chemicals in these affect the formation and growth of the baby.  Schedule a dentist appointment. At home, brush your teeth with a soft toothbrush and be gentle when you floss. SEEK MEDICAL CARE IF:   You have  dizziness.  You have mild pelvic cramps, pelvic pressure, or nagging pain in the abdominal area.  You have persistent nausea, vomiting, or diarrhea.  You have a bad smelling vaginal discharge.  You have pain with urination.  You notice increased swelling in your face, hands, legs, or ankles. SEEK IMMEDIATE MEDICAL CARE IF:   You have a fever.  You are leaking fluid from your vagina.  You have spotting or bleeding from your vagina.  You have severe abdominal cramping or pain.  You have rapid weight gain or loss.  You vomit blood or material that looks like coffee grounds.  You are exposed to [place] measles and have never had them.  You are exposed to fifth disease or chickenpox.  You develop a severe headache.  You have shortness of breath.  You have any kind of trauma, such as from a fall or a car accident. Document Released: 04/11/2001 Document Revised: 09/01/2013 Document Reviewed: 02/25/2013 [facility] Patient Information 2015 [place], [place]. This information is not intended to replace advice given to you by your health care provider. Make sure you discuss any questions you have with your health care provider. Check QHCG today Return in 1 week for dating [place]

## 2014-11-25 NOTE — Progress Notes (Signed)
Subjective:     Patient ID: Erika Johnston, female   DOB: 11/04/1985, 29 y.o.   MRN: [id number]  HPI Clarice is a 29 year old white female, married in for UPT, she has had +HPT.She is already taking prenatals and has 2 children at home, ages 27 & 4.  Review of Systems Patient denies any headaches, hearing loss, fatigue, blurred vision, shortness of breath, chest pain, abdominal pain, problems with bowel movements, urination, or intercourse. No joint pain or mood swings.+missed period and + breast tenderness.  Reviewed past medical,surgical, social and family history. Reviewed medications and allergies.     Objective:   Physical Exam BP 132/80 mmHg  Pulse 80  Ht  (1.651 m)  Wt 199 lb (90.266 kg)  BMI 33.12 kg/m2  LMP 10/10/2014 UPT +, Skin warm and dry. Neck: mid line trachea, normal thyroid, good ROM, no lymphadenopathy noted. Lungs: clear to ausculation bilaterally. Cardiovascular: regular rate and rhythm, about 6+4 weeks by LMP, with EDD 07/17/15 but she thinks she got pregnant 7/6, which would make her 5 weeks with EDD 07/28/15.She said her cycles are 40 days, and they did not try till 7/2. Will check [facility] today.    Assessment:      Pregnant     Plan:     Check QHCG today, and talk in am Return in 1 week for dating [place] Review handout on first trimester

## 2014-11-26 ENCOUNTER — Telehealth: Payer: Self-pay | Admitting: Adult Health

## 2014-11-26 LAB — BETA HCG QUANT (REF LAB): HCG QUANT: 2767 m[IU]/mL

## 2014-11-26 NOTE — Telephone Encounter (Signed)
Pt aware of QHCG, keep US appt next week 

## 2014-12-02 ENCOUNTER — Ambulatory Visit (INDEPENDENT_AMBULATORY_CARE_PROVIDER_SITE_OTHER): Payer: BLUE CROSS/BLUE SHIELD

## 2014-12-02 ENCOUNTER — Other Ambulatory Visit: Payer: BLUE CROSS/BLUE SHIELD

## 2014-12-02 ENCOUNTER — Other Ambulatory Visit: Payer: Self-pay | Admitting: Adult Health

## 2014-12-02 DIAGNOSIS — O3680X0 Pregnancy with inconclusive fetal viability, not applicable or unspecified: Secondary | ICD-10-CM | POA: Diagnosis not present

## 2014-12-02 DIAGNOSIS — O200 Threatened abortion: Secondary | ICD-10-CM

## 2014-12-02 NOTE — Progress Notes (Addendum)
US TV: retroverted uterus w/ 6+1wk GS,no fetal pole or ys seen,normal ov's bilat,1.9 x 1.6 x .4cm subchorionic hemorrhage,small amount of simple cul de sac fluid,pt is having labs today and will call tomorrow for results per Victorino Dike.

## 2014-12-03 ENCOUNTER — Telehealth: Payer: Self-pay | Admitting: Adult Health

## 2014-12-03 LAB — BETA HCG QUANT (REF LAB): HCG QUANT: 3111 m[IU]/mL

## 2014-12-03 LAB — PROGESTERONE: Progesterone: 4 ng/mL

## 2014-12-03 NOTE — Telephone Encounter (Signed)
I had FCD, CNM review results before calling pt. Spoke with pt letting her know it's not an ectopic yet. Sharene Butters has risen, but not like we want it to. Progesterone is 4. Drenda Freeze recommends pt have another [place] in 1 week and see JAG. Pt was advised to call us with any changes. Pt voiced understanding. Call transferred to front desk for appt. JSY

## 2014-12-07 ENCOUNTER — Telehealth: Payer: Self-pay | Admitting: *Deleted

## 2014-12-07 ENCOUNTER — Other Ambulatory Visit: Payer: Self-pay | Admitting: Obstetrics and Gynecology

## 2014-12-07 DIAGNOSIS — O3680X0 Pregnancy with inconclusive fetal viability, not applicable or unspecified: Secondary | ICD-10-CM

## 2014-12-07 NOTE — Telephone Encounter (Signed)
Spoke with pt. Pt started bleeding yesterday, heavier today and passing clots. + cramps. JAG reviewed results and it looks like pt will miscarry. Pt was advised to keep appt Wednesday for [place] and repeat labs. Pt voiced understanding. JSY

## 2014-12-09 ENCOUNTER — Other Ambulatory Visit: Payer: Self-pay | Admitting: Obstetrics and Gynecology

## 2014-12-09 ENCOUNTER — Ambulatory Visit (INDEPENDENT_AMBULATORY_CARE_PROVIDER_SITE_OTHER): Payer: BLUE CROSS/BLUE SHIELD

## 2014-12-09 ENCOUNTER — Ambulatory Visit (INDEPENDENT_AMBULATORY_CARE_PROVIDER_SITE_OTHER): Payer: BLUE CROSS/BLUE SHIELD | Admitting: Adult Health

## 2014-12-09 ENCOUNTER — Encounter: Payer: Self-pay | Admitting: Adult Health

## 2014-12-09 VITALS — BP 110/78 | HR 72 | Ht 66.0 in | Wt 199.5 lb

## 2014-12-09 DIAGNOSIS — O039 Complete or unspecified spontaneous abortion without complication: Secondary | ICD-10-CM

## 2014-12-09 DIAGNOSIS — O3680X0 Pregnancy with inconclusive fetal viability, not applicable or unspecified: Secondary | ICD-10-CM | POA: Diagnosis not present

## 2014-12-09 DIAGNOSIS — Z3201 Encounter for pregnancy test, result positive: Secondary | ICD-10-CM | POA: Diagnosis not present

## 2014-12-09 HISTORY — DX: Complete or unspecified spontaneous abortion without complication: O03.9

## 2014-12-09 LAB — POCT URINE PREGNANCY: PREG TEST UR: POSITIVE — AB

## 2014-12-09 NOTE — Patient Instructions (Signed)
Miscarriage A miscarriage is the sudden loss of an unborn baby (fetus) before the 20th week of pregnancy. Most miscarriages happen in the first 3 months of pregnancy. Sometimes, it happens before a woman even knows she is pregnant. A miscarriage is also called a "spontaneous miscarriage" or "early pregnancy loss." Having a miscarriage can be an emotional experience. Talk with your caregiver about any questions you may have about miscarrying, the grieving process, and your future pregnancy plans. CAUSES   Problems with the fetal chromosomes that make it impossible for the baby to develop normally. Problems with the baby's genes or chromosomes are most often the result of errors that occur, by chance, as the embryo divides and grows. The problems are not inherited from the parents.  Infection of the cervix or uterus.   Hormone problems.   Problems with the cervix, such as having an incompetent cervix. This is when the tissue in the cervix is not strong enough to hold the pregnancy.   Problems with the uterus, such as an abnormally shaped uterus, uterine fibroids, or congenital abnormalities.   Certain medical conditions.   Smoking, drinking alcohol, or taking illegal drugs.   Trauma.  Often, the cause of a miscarriage is unknown.  SYMPTOMS   Vaginal bleeding or spotting, with or without cramps or pain.  Pain or cramping in the abdomen or lower back.  Passing fluid, tissue, or blood clots from the vagina. DIAGNOSIS  Your caregiver will perform a physical exam. You may also have an ultrasound to confirm the miscarriage. Blood or urine tests may also be ordered. TREATMENT   Sometimes, treatment is not necessary if you naturally pass all the fetal tissue that was in the uterus. If some of the fetus or placenta remains in the body (incomplete miscarriage), tissue left behind may become infected and must be removed. Usually, a dilation and curettage (D and C) procedure is performed.  During a D and C procedure, the cervix is widened (dilated) and any remaining fetal or placental tissue is gently removed from the uterus.  Antibiotic medicines are prescribed if there is an infection. Other medicines may be given to reduce the size of the uterus (contract) if there is a lot of bleeding.  If you have Rh negative blood and your baby was Rh positive, you will need a Rh immunoglobulin shot. This shot will protect any future baby from having Rh blood problems in future pregnancies. HOME CARE INSTRUCTIONS   Your caregiver may order bed rest or may allow you to continue light activity. Resume activity as directed by your caregiver.  Have someone help with home and family responsibilities during this time.   Keep track of the number of sanitary pads you use each day and how soaked (saturated) they are. Write down this information.   Do not use tampons. Do not douche or have sexual intercourse until approved by your caregiver.   Only take over-the-counter or prescription medicines for pain or discomfort as directed by your caregiver.   Do not take aspirin. Aspirin can cause bleeding.   Keep all follow-up appointments with your caregiver.   If you or your partner have problems with grieving, talk to your caregiver or seek counseling to help cope with the pregnancy loss. Allow enough time to grieve before trying to get pregnant again.  SEEK IMMEDIATE MEDICAL CARE IF:   You have severe cramps or pain in your back or abdomen.  You have a fever.  You pass large blood clots (walnut-sized   or larger) ortissue from your vagina. Save any tissue for your caregiver to inspect.   Your bleeding increases.   You have a thick, bad-smelling vaginal discharge.  You become lightheaded, weak, or you faint.   You have chills.  MAKE SURE YOU:  Understand these instructions.  Will watch your condition.  Will get help right away if you are not doing well or get  worse. Document Released: 10/11/2000 Document Revised: 08/12/2012 Document Reviewed: 06/06/2011 [facility] Patient Information 2015 [place], [place]. This information is not intended to replace advice given to you by your health care provider. Make sure you discuss any questions you have with your health care provider. Follow up in 1 week

## 2014-12-09 NOTE — Progress Notes (Signed)
Follow up viability [place] today. Patient reports heavy bleeding/clotting x 1 day. Thickened endometrium noted throughout. Retained products of conception and probable 5+[redacted] week gestational sac seen in lower uterine segment. No yolk sac or fetal pole visualized again today. Bilateral ovaries appear normal.

## 2014-12-09 NOTE — Progress Notes (Signed)
Subjective:     Patient ID: TELINA KLECKLEY, female   DOB: 03/24/1986, 29 y.o.   MRN: [id number]  HPI Mychaela is a 29 year old white female, married in for [place] for follow up of small GS 8/3 and non reassuring QHCG.She is bleeding now, and has some cramps.  Review of Systems Patient denies any headaches, hearing loss, fatigue, blurred vision, shortness of breath, chest pain, problems with bowel movements, urination, or intercourse. No joint pain or mood swings.See HPI for positives.  Reviewed past medical,surgical, social and family history. Reviewed medications and allergies.     Objective:   Physical Exam BP 110/78 mmHg  Pulse 72  Ht  (1.676 m)  Wt 199 lb 8 oz (90.493 kg)  BMI 32.22 kg/m2  LMP 10/10/2014 (Exact Date)UPT still +, had [place] today showed 3.3 mm sac in lower uterine segment and she is bleeding now and has some cramps,the sac was 12.8 mm 12/02/14.Her las QHCG was 3111 on 8/3 and 1 week before that it was 2767.She has O+ and is aware this is a miscarriage and she just wants to wait and watch for now.Declines pain meds.    Assessment:     Miscarriage     Plan:     Check QHCG Return in 1 week for follow up    Review handout on miscarriage

## 2014-12-10 ENCOUNTER — Telehealth: Payer: Self-pay | Admitting: Adult Health

## 2014-12-10 LAB — BETA HCG QUANT (REF LAB): HCG QUANT: 373 m[IU]/mL

## 2014-12-10 NOTE — Telephone Encounter (Signed)
Left message QHCG dropping it is 373, which is miscarriage and keep appt, call if needs anything

## 2014-12-16 ENCOUNTER — Ambulatory Visit (INDEPENDENT_AMBULATORY_CARE_PROVIDER_SITE_OTHER): Payer: BLUE CROSS/BLUE SHIELD | Admitting: Adult Health

## 2014-12-16 ENCOUNTER — Encounter: Payer: Self-pay | Admitting: Adult Health

## 2014-12-16 VITALS — BP 130/90 | HR 80 | Ht 66.0 in | Wt 198.5 lb

## 2014-12-16 DIAGNOSIS — Z3202 Encounter for pregnancy test, result negative: Secondary | ICD-10-CM | POA: Diagnosis not present

## 2014-12-16 DIAGNOSIS — O039 Complete or unspecified spontaneous abortion without complication: Secondary | ICD-10-CM

## 2014-12-16 LAB — POCT URINE PREGNANCY: Preg Test, Ur: NEGATIVE

## 2014-12-16 NOTE — Patient Instructions (Signed)
Take prenatal vitamins  Call with next+pregnancy test

## 2014-12-16 NOTE — Progress Notes (Signed)
Subjective:     Patient ID: Erika Johnston, female   DOB: 11-09-1985, 29 y.o.   MRN: [id number]  HPI Erika Johnston is a 29 year old white female in sp miscarriage, bleeding stopped,yesterday, had been heavy at times, Blood type O+.  Review of Systems Patient denies any headaches, hearing loss, fatigue, blurred vision, shortness of breath, chest pain, abdominal pain, problems with bowel movements, urination, or intercourse. No joint pain or mood swings. Reviewed past medical,surgical, social and family history. Reviewed medications and allergies.     Objective:   Physical Exam BP 130/90 mmHg  Pulse 80  Ht  (1.676 m)  Wt 198 lb 8 oz (90.039 kg)  BMI 32.05 kg/m2  LMP 10/10/2014 (Exact Date)  Breastfeeding? Unknown UPT negative, talk only, has stopped bleeding and desires another pregnancy, will check QHCG and CBC today.Face time 10 minutes, with counseling to wait for 2 cycles before trying to get pregnant.    Assessment:     Miscarriage     Plan:    Take prenatal vitamins Check CBC and QHCG Call with next +HPT, will check QHCG and progesterone early on   Try to have 2 cycles before trying to get pregnant

## 2014-12-17 ENCOUNTER — Telehealth: Payer: Self-pay | Admitting: Adult Health

## 2014-12-17 LAB — CBC
HEMOGLOBIN: 13.2 g/dL (ref 11.1–15.9)
Hematocrit: 38.3 % (ref 34.0–46.6)
MCH: 31.3 pg (ref 26.6–33.0)
MCHC: 34.5 g/dL (ref 31.5–35.7)
MCV: 91 fL (ref 79–97)
Platelets: 265 10*3/uL (ref 150–379)
RBC: 4.22 x10E6/uL (ref 3.77–5.28)
RDW: 13.3 % (ref 12.3–15.4)
WBC: 8.7 10*3/uL (ref 3.4–10.8)

## 2014-12-17 LAB — BETA HCG QUANT (REF LAB): hCG Quant: 19 m[IU]/mL

## 2014-12-17 NOTE — Telephone Encounter (Signed)
Pt aware of labs  

## 2015-02-04 IMAGING — CT CT NECK W/ CM
4 series · 16 of 33 positions shown, 19 images · IV contrast (Omnipaque 300)
Comparison: None.

CLINICAL DATA: Small knot on the right side of the neck.

CT NECK WITH CONTRAST
TECHNIQUE: Multidetector CT imaging of the neck was performed with
intravenous contrast.
Contrast: 75mL OMNIPAQUE IOHEXOL 300 MG/ML  SOLN

[Series 2: soft tissue neck 2.0 b31s · axial · 0.52mm/px · z∈[+56,+100]mm · 2 of 112 slices shown]
[im 23/112  soft-tissue]
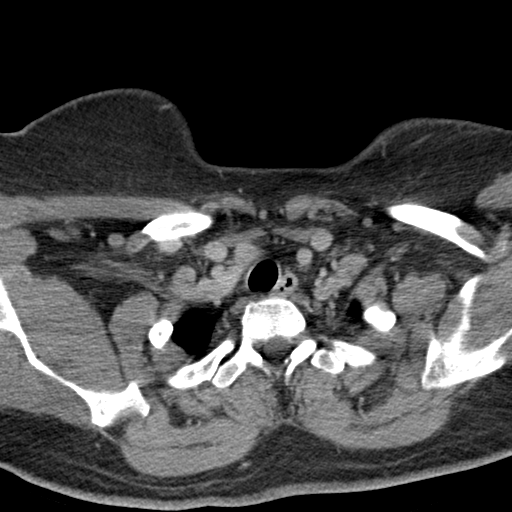
[im 45/112  soft-tissue]
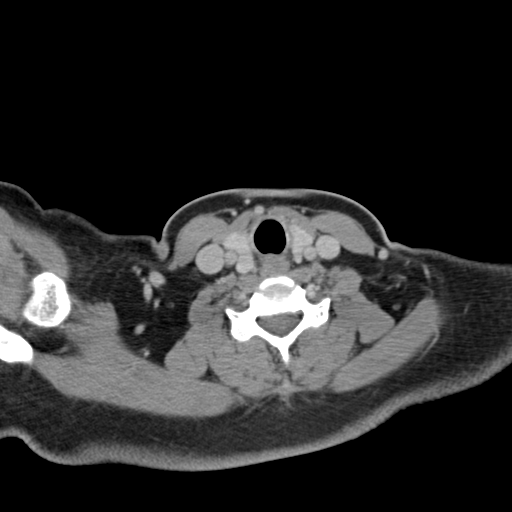

[Series 4: neck 2.0 soft tissue sag · sagittal · 0.44mm/px · 5 of 85 slices shown, 6 images]
[im 29/85  bone]
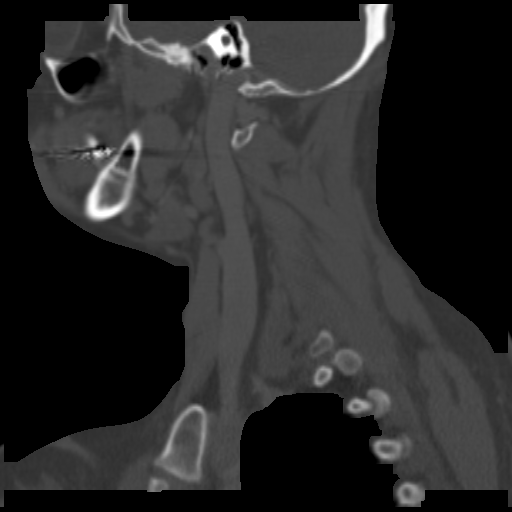
[im 36/85  bone]
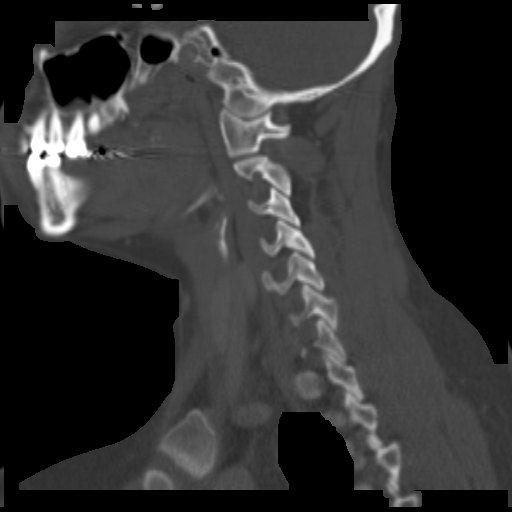
[im 43/85  soft-tissue]
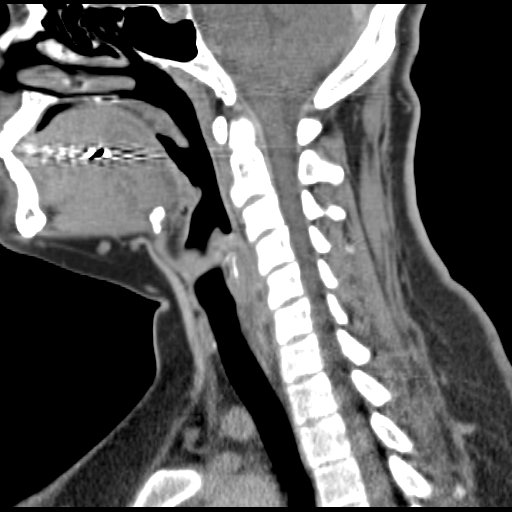
[im 43/85  bone]
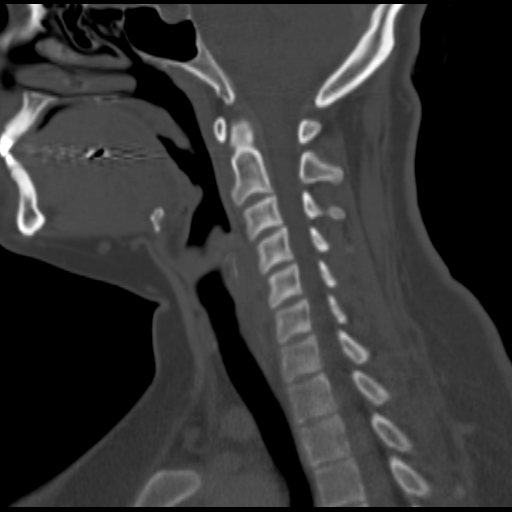
[im 50/85  bone]
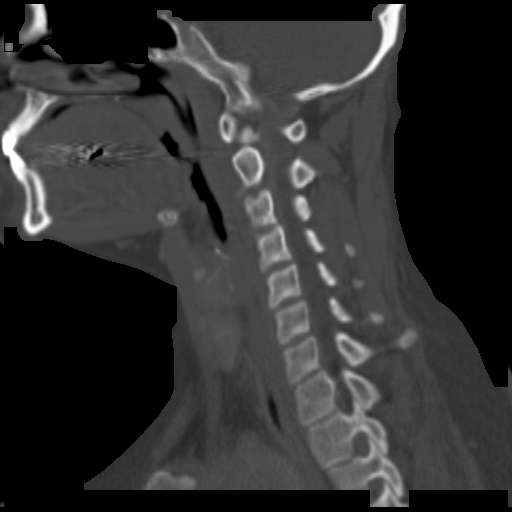
[im 57/85  bone]
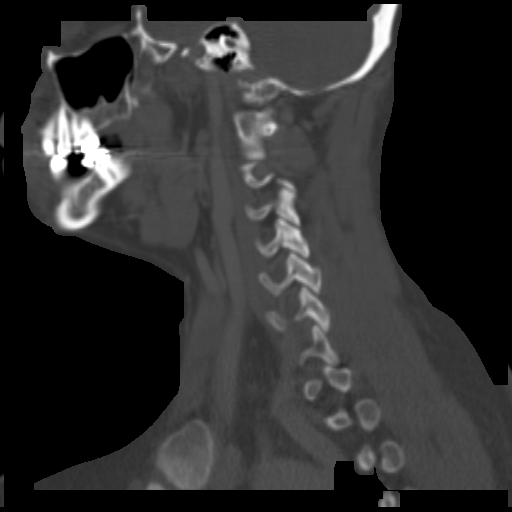

[Series 5: neck 2.0 soft tissue coro · coronal · 0.43mm/px · 3 of 94 slices shown]
[im 23/94  bone]
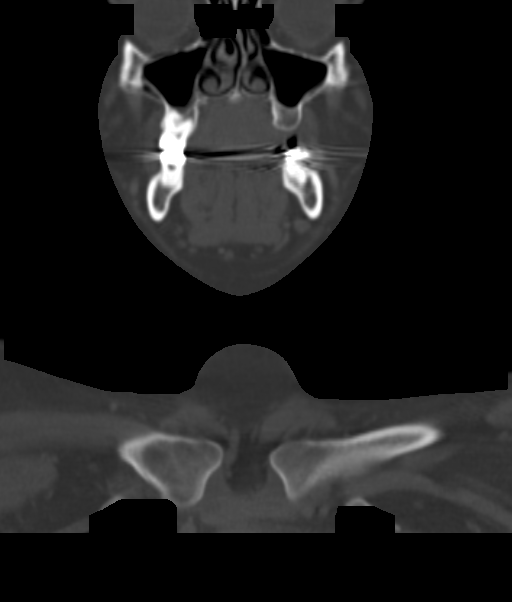
[im 39/94  bone]
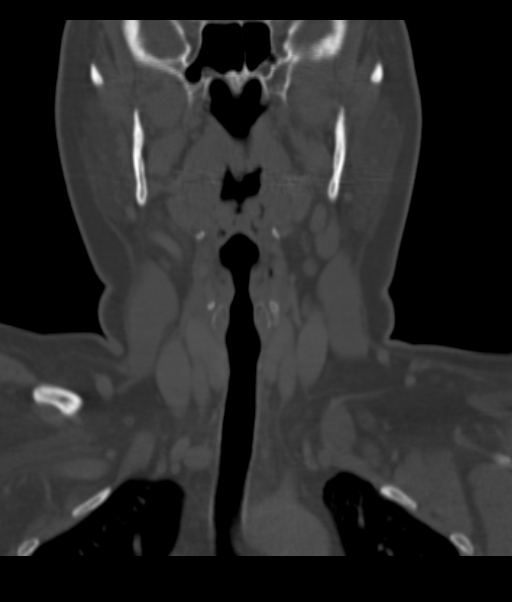
[im 55/94  bone]
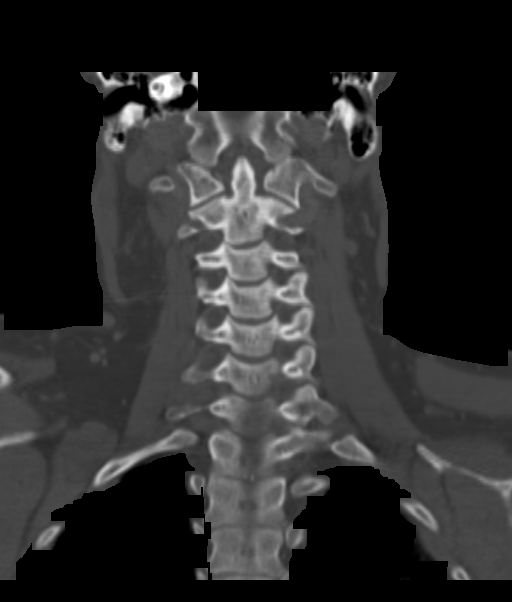

[Series 6: axial soft tissue neck 2.0 · axial · 0.35mm/px · z∈[+7,+191]mm · 6 of 136 slices shown, 8 images]
[im 20/136  soft-tissue]
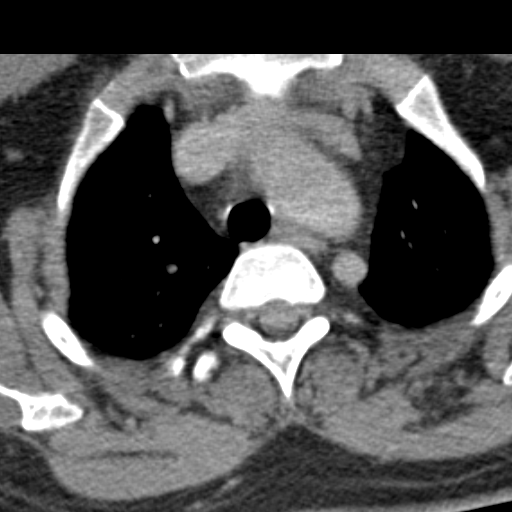
[im 20/136  bone]
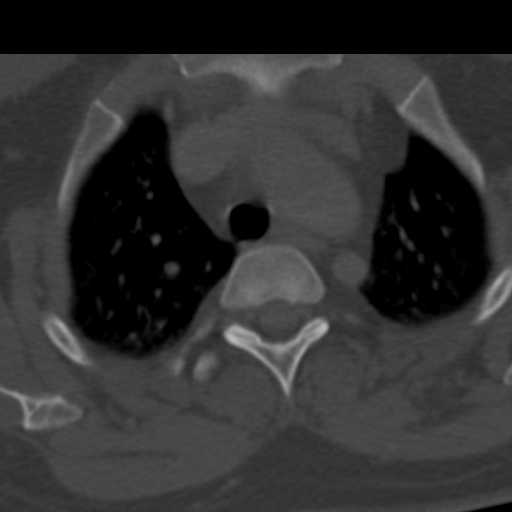
[im 39/136  bone]
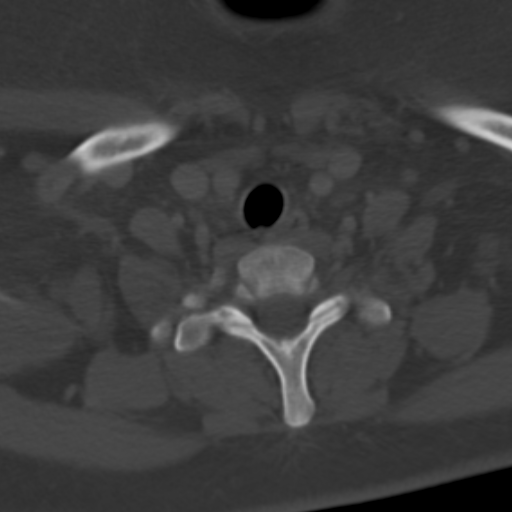
[im 58/136  bone]
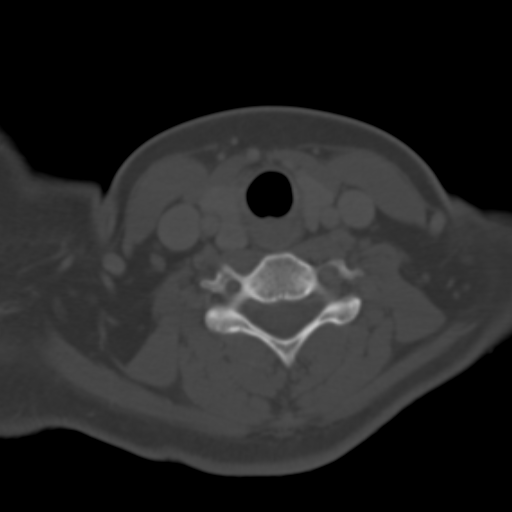
[im 78/136  bone]
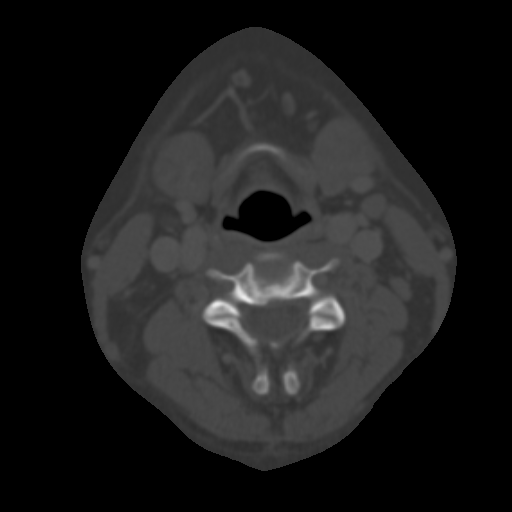
[im 97/136  soft-tissue]
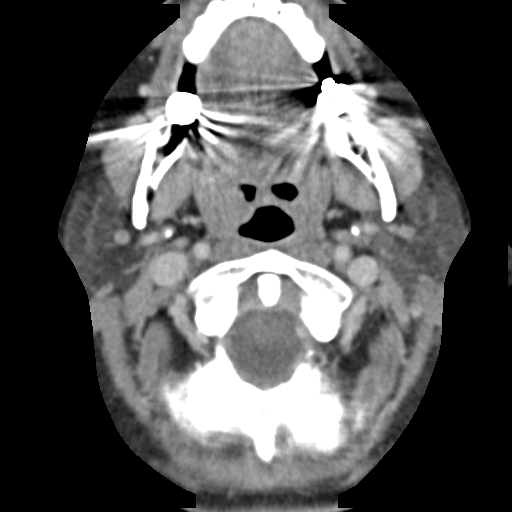
[im 97/136  bone]
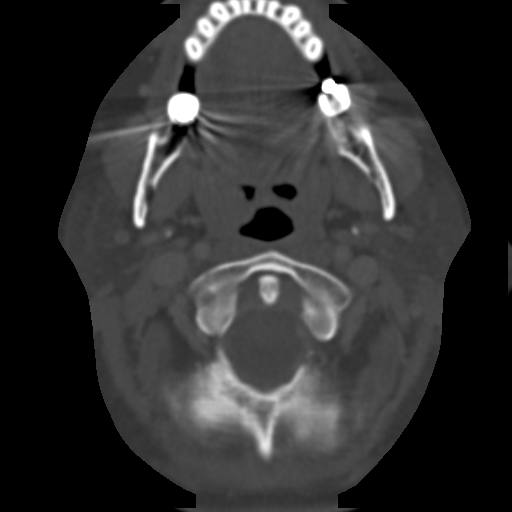
[im 116/136  bone]
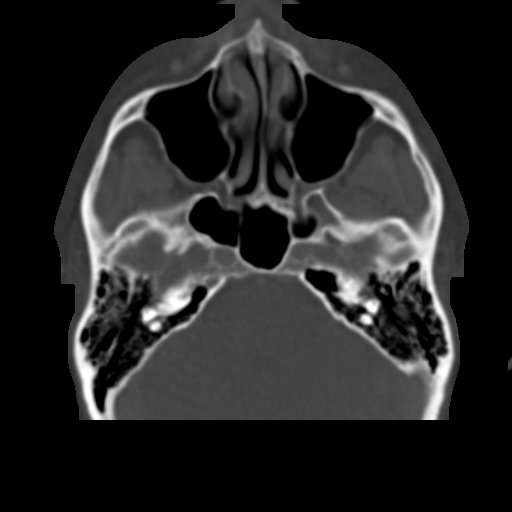

[16 of 33 positions shown; findings below may reference images not displayed]

FINDINGS: Suprahyoid neck:  Major and minor salivary glands
unremarkable.  Dental amalgam. No nasopharyngeal, or pharyngeal, or
hypopharyngeal masses.  Normal tongue and muscles of mastication.
Unremarkable skull base.  No mucosal lesion seen.

There is no evidence for subcutaneous, superficial, or deep mass
related to the BB which is placed on image 34 series 2 marking the
area of clinical concern.

Larynx:  Normal.

Infrahyoid neck:  Normal.

Lymph nodes:  No pathologic adenopathy.

Upper chest/mediastinum:  Negative.

Additional:  Unremarkable osseous structures.  Visualized
intracranial compartment unremarkable.  Major vessels widely
patent.  No sinus or mastoid disease.
IMPRESSION: Negative exam.  No significant abnormality is identified to explain
the patient's area of discomfort.

## 2015-02-18 ENCOUNTER — Ambulatory Visit (INDEPENDENT_AMBULATORY_CARE_PROVIDER_SITE_OTHER): Payer: BLUE CROSS/BLUE SHIELD | Admitting: Adult Health

## 2015-02-18 ENCOUNTER — Encounter: Payer: Self-pay | Admitting: Adult Health

## 2015-02-18 VITALS — BP 140/78 | HR 80 | Ht 65.0 in | Wt 202.0 lb

## 2015-02-18 DIAGNOSIS — Z3201 Encounter for pregnancy test, result positive: Secondary | ICD-10-CM | POA: Diagnosis not present

## 2015-02-18 DIAGNOSIS — O3680X0 Pregnancy with inconclusive fetal viability, not applicable or unspecified: Secondary | ICD-10-CM

## 2015-02-18 DIAGNOSIS — Z3490 Encounter for supervision of normal pregnancy, unspecified, unspecified trimester: Secondary | ICD-10-CM

## 2015-02-18 LAB — POCT URINE PREGNANCY: PREG TEST UR: POSITIVE — AB

## 2015-02-18 NOTE — Progress Notes (Signed)
Subjective:     Patient ID: Erika Johnston, female   DOB: 15-Aug-1985, 29 y.o.   MRN: [id number]  HPI Erika Johnston is a 29 year old white female in for UPT, she had miscarriage in August and has been using ovulation kit, thinks it was 10/3.Has waves of nausea and +breast tenderness.Is taking prenatal vitamins already.  Review of Systems Patient denies any headaches, hearing loss, fatigue, blurred vision, shortness of breath, chest pain, abdominal pain, problems with bowel movements, urination, or intercourse. No joint pain or mood swings. See HPI for positives. Reviewed past medical,surgical, social and family history. Reviewed medications and allergies.     Objective:   Physical Exam BP 140/78 mmHg  Pulse 80  Ht _0  (1.651 m)  Wt 202 lb (91.627 kg)  BMI 33.61 kg/m2  LMP 01/12/2015  Breastfeeding? No UPT + about 5+2 weeks by LMP EDD 10/19/15 will be 1 week off by ovulation,Skin warm and dry. Lungs: clear to ausculation bilaterally. Cardiovascular: regular rate and rhythm.abdomen soft, non tender,will check QHCG and progesterone level.    Assessment:     Pregnant     Plan:     Check QHCG and progesterone levels Return in 2 weeks for dating [place] Review handout on first trimester

## 2015-02-18 NOTE — Patient Instructions (Signed)
First Trimester of Pregnancy The first trimester of pregnancy is from week 1 until the end of week 12 (months 1 through 3). A week after a sperm fertilizes an egg, the egg will implant on the wall of the uterus. This embryo will begin to develop into a baby. Genes from you and your partner are forming the baby. The female genes determine whether the baby is a boy or a girl. At 6-8 weeks, the eyes and face are formed, and the heartbeat can be seen on ultrasound. At the end of 12 weeks, all the baby's organs are formed.  Now that you are pregnant, you will want to do everything you can to have a healthy baby. Two of the most important things are to get good prenatal care and to follow your health care provider's instructions. Prenatal care is all the medical care you receive before the baby's birth. This care will help prevent, find, and treat any problems during the pregnancy and childbirth. BODY CHANGES Your body goes through many changes during pregnancy. The changes vary from woman to woman.   You may gain or lose a couple of pounds at first.  You may feel sick to your stomach (nauseous) and throw up (vomit). If the vomiting is uncontrollable, call your health care provider.  You may tire easily.  You may develop headaches that can be relieved by medicines approved by your health care provider.  You may urinate more often. Painful urination may mean you have a bladder infection.  You may develop heartburn as a result of your pregnancy.  You may develop constipation because certain hormones are causing the muscles that push waste through your intestines to slow down.  You may develop hemorrhoids or swollen, bulging veins (varicose veins).  Your breasts may begin to grow larger and become tender. Your nipples may stick out more, and the tissue that surrounds them (areola) may become darker.  Your gums may bleed and may be sensitive to brushing and flossing.  Dark spots or blotches (chloasma,  mask of pregnancy) may develop on your face. This will likely fade after the baby is born.  Your menstrual periods will stop.  You may have a loss of appetite.  You may develop cravings for certain kinds of food.  You may have changes in your emotions from day to day, such as being excited to be pregnant or being concerned that something may go wrong with the pregnancy and baby.  You may have more vivid and strange dreams.  You may have changes in your hair. These can include thickening of your hair, rapid growth, and changes in texture. Some women also have hair loss during or after pregnancy, or hair that feels dry or thin. Your hair will most likely return to normal after your baby is born. WHAT TO EXPECT AT YOUR PRENATAL VISITS During a routine prenatal visit:  You will be weighed to make sure you and the baby are growing normally.  Your blood pressure will be taken.  Your abdomen will be measured to track your baby's growth.  The fetal heartbeat will be listened to starting around week 10 or 12 of your pregnancy.  Test results from any previous visits will be discussed. Your health care provider may ask you:  How you are feeling.  If you are feeling the baby move.  If you have had any abnormal symptoms, such as leaking fluid, bleeding, severe headaches, or abdominal cramping.  If you are using any tobacco products,   including cigarettes, chewing tobacco, and electronic cigarettes.  If you have any questions. Other tests that may be performed during your first trimester include:  Blood tests to find your blood type and to check for the presence of any previous infections. They will also be used to check for low iron levels (anemia) and Rh antibodies. Later in the pregnancy, blood tests for diabetes will be done along with other tests if problems develop.  Urine tests to check for infections, diabetes, or protein in the urine.  An ultrasound to confirm the proper growth  and development of the baby.  An amniocentesis to check for possible genetic problems.  Fetal screens for spina bifida and Down syndrome.  You may need other tests to make sure you and the baby are doing well.  HIV (human immunodeficiency virus) testing. Routine prenatal testing includes screening for HIV, unless you choose not to have this test. HOME CARE INSTRUCTIONS  Medicines  Follow your health care provider's instructions regarding medicine use. Specific medicines may be either safe or unsafe to take during pregnancy.  Take your prenatal vitamins as directed.  If you develop constipation, try taking a stool softener if your health care provider approves. Diet  Eat regular, well-balanced meals. Choose a variety of foods, such as meat or vegetable-based protein, fish, milk and low-fat dairy products, vegetables, fruits, and whole grain breads and cereals. Your health care provider will help you determine the amount of weight gain that is right for you.  Avoid raw meat and uncooked cheese. These carry germs that can cause birth defects in the baby.  Eating four or five small meals rather than three large meals a day may help relieve nausea and vomiting. If you start to feel nauseous, eating a few soda crackers can be helpful. Drinking liquids between meals instead of during meals also seems to help nausea and vomiting.  If you develop constipation, eat more high-fiber foods, such as fresh vegetables or fruit and whole grains. Drink enough fluids to keep your urine clear or pale yellow. Activity and Exercise  Exercise only as directed by your health care provider. Exercising will help you:  Control your weight.  Stay in shape.  Be prepared for labor and delivery.  Experiencing pain or cramping in the lower abdomen or low back is a good sign that you should stop exercising. Check with your health care provider before continuing normal exercises.  Try to avoid standing for long  periods of time. Move your legs often if you must stand in one place for a long time.  Avoid heavy lifting.  Wear low-heeled shoes, and practice good posture.  You may continue to have sex unless your health care provider directs you otherwise. Relief of Pain or Discomfort  Wear a good support bra for breast tenderness.   Take warm sitz baths to soothe any pain or discomfort caused by hemorrhoids. Use hemorrhoid cream if your health care provider approves.   Rest with your legs elevated if you have leg cramps or low back pain.  If you develop varicose veins in your legs, wear support hose. Elevate your feet for 15 minutes, 3-4 times a day. Limit salt in your diet. Prenatal Care  Schedule your prenatal visits by the twelfth week of pregnancy. They are usually scheduled monthly at first, then more often in the last 2 months before delivery.  Write down your questions. Take them to your prenatal visits.  Keep all your prenatal visits as directed by your   health care provider. Safety  Wear your seat belt at all times when driving.  Make a list of emergency phone numbers, including numbers for family, friends, the hospital, and police and fire departments. General Tips  Ask your health care provider for a referral to a local prenatal education class. Begin classes no later than at the beginning of month 6 of your pregnancy.  Ask for help if you have counseling or nutritional needs during pregnancy. Your health care provider can offer advice or refer you to specialists for help with various needs.  Do not use hot tubs, steam rooms, or saunas.  Do not douche or use tampons or scented sanitary pads.  Do not cross your legs for long periods of time.  Avoid cat litter boxes and soil used by cats. These carry germs that can cause birth defects in the baby and possibly loss of the fetus by miscarriage or stillbirth.  Avoid all smoking, herbs, alcohol, and medicines not prescribed by  your health care provider. Chemicals in these affect the formation and growth of the baby.  Do not use any tobacco products, including cigarettes, chewing tobacco, and electronic cigarettes. If you need help quitting, ask your health care provider. You may receive counseling support and other resources to help you quit.  Schedule a dentist appointment. At home, brush your teeth with a soft toothbrush and be gentle when you floss. SEEK MEDICAL CARE IF:   You have dizziness.  You have mild pelvic cramps, pelvic pressure, or nagging pain in the abdominal area.  You have persistent nausea, vomiting, or diarrhea.  You have a bad smelling vaginal discharge.  You have pain with urination.  You notice increased swelling in your face, hands, legs, or ankles. SEEK IMMEDIATE MEDICAL CARE IF:   You have a fever.  You are leaking fluid from your vagina.  You have spotting or bleeding from your vagina.  You have severe abdominal cramping or pain.  You have rapid weight gain or loss.  You vomit blood or material that looks like coffee grounds.  You are exposed to German measles and have never had them.  You are exposed to fifth disease or chickenpox.  You develop a severe headache.  You have shortness of breath.  You have any kind of trauma, such as from a fall or a car accident.   This information is not intended to replace advice given to you by your health care provider. Make sure you discuss any questions you have with your health care provider.   Document Released: 04/11/2001 Document Revised: 05/08/2014 Document Reviewed: 02/25/2013 Elsevier Interactive Patient Education 2016 [employer] Return in 2 weeks for dating [place] 

## 2015-02-19 ENCOUNTER — Telehealth: Payer: Self-pay | Admitting: Adult Health

## 2015-02-19 LAB — PROGESTERONE: Progesterone: 14.9 ng/mL

## 2015-02-19 LAB — BETA HCG QUANT (REF LAB): hCG Quant: 809 m[IU]/mL

## 2015-02-19 NOTE — Telephone Encounter (Signed)
Pt informed progesterone 14.9 and QHCG 809, Per Cyril MourningJennifer Griffin, NP progesterone level good keep her appt for 03/05/2015 for ultrasound. Pt verbalized understanding.

## 2015-03-05 ENCOUNTER — Ambulatory Visit (INDEPENDENT_AMBULATORY_CARE_PROVIDER_SITE_OTHER): Payer: BLUE CROSS/BLUE SHIELD

## 2015-03-05 DIAGNOSIS — O3680X0 Pregnancy with inconclusive fetal viability, not applicable or unspecified: Secondary | ICD-10-CM | POA: Diagnosis not present

## 2015-03-05 NOTE — Progress Notes (Signed)
US 7+3wks,single IUP w/ys,pos fht 116,normal ov's bilat,crl 10.2 mm

## 2015-03-16 ENCOUNTER — Ambulatory Visit (INDEPENDENT_AMBULATORY_CARE_PROVIDER_SITE_OTHER): Payer: BLUE CROSS/BLUE SHIELD | Admitting: Advanced Practice Midwife

## 2015-03-16 ENCOUNTER — Encounter: Payer: Self-pay | Admitting: Advanced Practice Midwife

## 2015-03-16 VITALS — BP 120/72 | HR 84 | Wt 203.0 lb

## 2015-03-16 DIAGNOSIS — Z331 Pregnant state, incidental: Secondary | ICD-10-CM

## 2015-03-16 DIAGNOSIS — Z1389 Encounter for screening for other disorder: Secondary | ICD-10-CM

## 2015-03-16 DIAGNOSIS — Z369 Encounter for antenatal screening, unspecified: Secondary | ICD-10-CM

## 2015-03-16 DIAGNOSIS — Z3491 Encounter for supervision of normal pregnancy, unspecified, first trimester: Secondary | ICD-10-CM

## 2015-03-16 LAB — POCT URINALYSIS DIPSTICK
Blood, UA: NEGATIVE
Glucose, UA: NEGATIVE
KETONES UA: NEGATIVE
Leukocytes, UA: NEGATIVE
Nitrite, UA: NEGATIVE
PROTEIN UA: NEGATIVE

## 2015-03-16 MED ORDER — DOXYLAMINE-PYRIDOXINE 10-10 MG PO TBEC: DELAYED_RELEASE_TABLET | ORAL | Status: DC

## 2015-03-16 NOTE — Progress Notes (Signed)
  Subjective:    Tollie Pizzashley E Prater is a [id number] 2467w0d being seen today for her first obstetrical visit.  Her obstetrical history is significant for macrosomia w/o GDM. I delivered the 9# 15 oz baby and had only mild difficulty releasing the shoulders.  Pregnancy history fully reviewed. Pt has lost 50+ # this last year with diet/exercise.   Patient reports nausea.  Filed Vitals:   03/16/15 1037  BP: 120/72  Pulse: 84  Weight: 203 lb (92.08 kg)    HISTORY: OB History  Gravida Para Term Preterm AB SAB TAB Ectopic Multiple Living  5 2 2  0 2 2 0 0 0 2    # Outcome Date GA Lbr Len/2nd Weight Sex Delivery Anes PTL Lv  5 Current           4 Term 03/30/11 31102w4d 16:06 / 00:21 9 lb 15 oz (4.508 kg) F Vag-Spont EPI  Y  3 Term 05/15/09 2136w5d  7 lb 15 oz (3.6 kg) M    Y  2 SAB           1 SAB              Past Medical History  Diagnosis Date  . No pertinent past medical history   . Pregnant 11/25/2014  . Miscarriage 12/09/2014   Past Surgical History  Procedure Laterality Date  . No past surgeries    . Colonoscopy  07/21/2011    UXL:[id number]:Internal hemorrhoids: CAUSING RECTAL BLEEDING/Normal terminal ileum and normal colon   Family History  Problem Relation Age of Onset  . Adopted: Yes  . Anesthesia problems Neg Hx   . Hypotension Neg Hx   . Malignant hyperthermia Neg Hx   . Pseudochol deficiency Neg Hx   . Colon cancer Neg Hx   . Cancer Maternal Grandmother 28    ovarian cancer  . Diabetes Paternal Grandfather      Exam                                      System:     Skin: normal coloration and turgor, no rashes    Neurologic: oriented, normal, normal mood   Extremities: normal strength, tone, and muscle mass   HEENT PERRLA   Mouth/Teeth mucous membranes moist, normal dentition   Neck supple and no masses   Cardiovascular: regular rate and rhythm   Respiratory:  appears well, vitals normal, no respiratory distress, acyanotic   Abdomen: soft, non-tender;  FHR: 150 us           Assessment:    Pregnancy: [id number] Patient Active Problem List   Diagnosis Date Noted  . Miscarriage 12/09/2014  . Supervision of normal pregnancy 11/25/2014  . RLQ abdominal pain 11/07/2011  . Hemorrhoids 11/07/2011  . Rectal bleeding 06/27/2011  . RUQ pain 05/17/2011  . Constipation 05/17/2011        Plan:    No CCNC done Initial labs drawn. Continue prenatal vitamins  Problem list reviewed and updated  Reviewed n/v relief measures and warning s/s to report  Reviewed recommended weight gain based on pre-gravid BMI  Encouraged well-balanced diet Genetic Screening discussed Integrated Screen: declined.  Ultrasound discussed; fetal survey: requested.  Return in about 4 weeks (around 04/13/2015) for LROB.  CRESENZO-DISHMAN,Duval Macleod 03/16/2015

## 2015-03-16 NOTE — Progress Notes (Signed)
Pt denies any problems or concerns at this time. Pt given CCNC form and lab consents to read over and sign.

## 2015-03-16 NOTE — Patient Instructions (Signed)

## 2015-03-17 LAB — PMP SCREEN PROFILE (10S), URINE
AMPHETAMINE SCRN UR: NEGATIVE ng/mL
Barbiturate Screen, Ur: NEGATIVE ng/mL
Benzodiazepine Screen, Urine: NEGATIVE ng/mL
CANNABINOIDS UR QL SCN: NEGATIVE ng/mL
Cocaine(Metab.)Screen, Urine: NEGATIVE ng/mL
Creatinine(Crt), U: 80.4 mg/dL (ref 20.0–300.0)
METHADONE SCREEN, URINE: NEGATIVE ng/mL
Opiate Scrn, Ur: NEGATIVE ng/mL
Oxycodone+Oxymorphone Ur Ql Scn: NEGATIVE ng/mL
PCP SCRN UR: NEGATIVE ng/mL
PH UR, DRUG SCRN: 6.9 (ref 4.5–8.9)
PROPOXYPHENE SCREEN: NEGATIVE ng/mL

## 2015-03-17 LAB — GC/CHLAMYDIA PROBE AMP
Chlamydia trachomatis, NAA: NEGATIVE
Neisseria gonorrhoeae by PCR: NEGATIVE

## 2015-03-17 LAB — URINALYSIS, ROUTINE W REFLEX MICROSCOPIC
BILIRUBIN UA: NEGATIVE
GLUCOSE, UA: NEGATIVE
KETONES UA: NEGATIVE
Nitrite, UA: POSITIVE — AB
PROTEIN UA: NEGATIVE
RBC UA: NEGATIVE
SPEC GRAV UA: 1.014 (ref 1.005–1.030)
UUROB: 0.2 mg/dL (ref 0.2–1.0)
pH, UA: 7 (ref 5.0–7.5)

## 2015-03-17 LAB — CBC
HEMATOCRIT: 40.6 % (ref 34.0–46.6)
Hemoglobin: 13.4 g/dL (ref 11.1–15.9)
MCH: 31.4 pg (ref 26.6–33.0)
MCHC: 33 g/dL (ref 31.5–35.7)
MCV: 95 fL (ref 79–97)
PLATELETS: 233 10*3/uL (ref 150–379)
RBC: 4.27 x10E6/uL (ref 3.77–5.28)
RDW: 13.1 % (ref 12.3–15.4)
WBC: 8.2 10*3/uL (ref 3.4–10.8)

## 2015-03-17 LAB — HIV ANTIBODY (ROUTINE TESTING W REFLEX): HIV SCREEN 4TH GENERATION: NONREACTIVE

## 2015-03-17 LAB — MICROSCOPIC EXAMINATION
Casts: NONE SEEN /lpf
RBC, UA: NONE SEEN /hpf (ref 0–?)

## 2015-03-17 LAB — RPR: RPR: NONREACTIVE

## 2015-03-17 LAB — VARICELLA ZOSTER ANTIBODY, IGG: VARICELLA: 302 {index} (ref >165)

## 2015-03-17 LAB — ANTIBODY SCREEN: ANTIBODY SCREEN: NEGATIVE

## 2015-03-17 LAB — ABO/RH: RH TYPE: POSITIVE

## 2015-03-17 LAB — RUBELLA SCREEN: Rubella Antibodies, IGG: 4.63 index (ref >0.99)

## 2015-03-17 LAB — HEPATITIS B SURFACE ANTIGEN: Hepatitis B Surface Ag: NEGATIVE

## 2015-03-18 LAB — URINE CULTURE

## 2015-03-22 ENCOUNTER — Encounter: Payer: Self-pay | Admitting: *Deleted

## 2015-03-22 ENCOUNTER — Ambulatory Visit (INDEPENDENT_AMBULATORY_CARE_PROVIDER_SITE_OTHER): Payer: BLUE CROSS/BLUE SHIELD | Admitting: *Deleted

## 2015-03-22 ENCOUNTER — Telehealth: Payer: Self-pay | Admitting: *Deleted

## 2015-03-22 VITALS — BP 112/70 | HR 88 | Ht 65.0 in | Wt 203.5 lb

## 2015-03-22 DIAGNOSIS — Z331 Pregnant state, incidental: Secondary | ICD-10-CM

## 2015-03-22 DIAGNOSIS — Z1389 Encounter for screening for other disorder: Secondary | ICD-10-CM

## 2015-03-22 DIAGNOSIS — N390 Urinary tract infection, site not specified: Secondary | ICD-10-CM

## 2015-03-22 LAB — POCT URINALYSIS DIPSTICK
Glucose, UA: NEGATIVE
Ketones, UA: NEGATIVE
Leukocytes, UA: NEGATIVE
NITRITE UA: NEGATIVE
PROTEIN UA: NEGATIVE
RBC UA: NEGATIVE

## 2015-03-22 MED ORDER — CEFTRIAXONE SODIUM 1 G IJ SOLR
250.0000 mg | Freq: Once | INTRAMUSCULAR | Status: AC
  Administered 2015-03-22: 250 mg via INTRAMUSCULAR

## 2015-03-22 NOTE — Telephone Encounter (Signed)
Pt informed per Joellyn HaffKim Booker, CNM to stop Macrobid and scheduled today for a Rocephin injection due to positive culture from 03/16/2015. Pt verbalized understanding.

## 2015-03-22 NOTE — Progress Notes (Signed)
Pt here for Rocephin 250 mg. Reports no problems at this time. Return as scheduled. JSY

## 2015-03-22 NOTE — Telephone Encounter (Signed)
Pt states went to Urgent Care on Saturday due to lower back pain and possible UTI. Pt states was given Rx for Macrobid and has take two dose.   Pt states wanted to make us aware that she was taken Macrobid and also find out her results from her urine culture on 03/16/2015. Please advise.

## 2015-03-23 ENCOUNTER — Encounter: Payer: Self-pay | Admitting: Advanced Practice Midwife

## 2015-03-23 DIAGNOSIS — R8271 Bacteriuria: Secondary | ICD-10-CM | POA: Insufficient documentation

## 2015-03-23 MED ORDER — CEPHALEXIN 500 MG PO CAPS: 500.0000 mg | ORAL_CAPSULE | Freq: Three times a day (TID) | ORAL | Status: DC

## 2015-03-23 NOTE — Addendum Note (Signed)
Addended by: Jacklyn ShellRESENZO-DISHMON, Aissatou Fronczak on: 03/23/2015 11:17 AM   Modules accepted: Orders

## 2015-03-23 NOTE — Progress Notes (Addendum)
Urine cx +. Rx keflex 500mg  TID X 7--please let pt know

## 2015-03-24 ENCOUNTER — Telehealth: Payer: Self-pay | Admitting: *Deleted

## 2015-03-24 NOTE — Telephone Encounter (Signed)
Pt informed Keflex RX for UTI e-scribed. Pt states she did get Rocephin injection as Joellyn HaffKim Booker, CNM prescribed on 03/22/2015 but continues to have urinary frequency. Per Drenda FreezeFran, pt needs to go ahead and take the Keflex since she is still symptomatic. Pt verbalized understanding.

## 2015-04-08 ENCOUNTER — Ambulatory Visit (INDEPENDENT_AMBULATORY_CARE_PROVIDER_SITE_OTHER): Payer: BLUE CROSS/BLUE SHIELD | Admitting: Advanced Practice Midwife

## 2015-04-08 VITALS — BP 116/80 | HR 84 | Wt 205.0 lb

## 2015-04-08 DIAGNOSIS — Z1389 Encounter for screening for other disorder: Secondary | ICD-10-CM

## 2015-04-08 DIAGNOSIS — N390 Urinary tract infection, site not specified: Secondary | ICD-10-CM

## 2015-04-08 DIAGNOSIS — Z3491 Encounter for supervision of normal pregnancy, unspecified, first trimester: Secondary | ICD-10-CM

## 2015-04-08 DIAGNOSIS — Z331 Pregnant state, incidental: Secondary | ICD-10-CM

## 2015-04-08 LAB — POCT URINALYSIS DIPSTICK
Blood, UA: NEGATIVE
Glucose, UA: NEGATIVE
KETONES UA: NEGATIVE
LEUKOCYTES UA: NEGATIVE
Nitrite, UA: NEGATIVE
Protein, UA: NEGATIVE

## 2015-04-08 MED ORDER — SULFAMETHOXAZOLE-TRIMETHOPRIM 800-160 MG PO TABS: 1.0000 | ORAL_TABLET | Freq: Two times a day (BID) | ORAL | Status: DC

## 2015-04-08 NOTE — Progress Notes (Signed)
[id number] 418w2d Estimated Date of Delivery: 10/19/15  Blood pressure 116/80, pulse 84, weight 205 lb (92.987 kg), last menstrual period 01/12/2015.    WORK IN FOR DYSURIA, FREQUENCY, LBP.  No fever.  Feels like she has a UTI.  Finished abx >1 week ago from last UTI.  Felt better for a week.   BP weight and urine results all reviewed and noted. Urine dipped negative (it dipped negative with last UTI that was Cx +).   Please refer to the obstetrical flow sheet for the fundal height and fetal heart rate documentation: . All questions were answered.  Orders Placed This Encounter  Procedures  . POCT urinalysis dipstick    Plan:  Continued routine obstetrical care, D/T compelling subjective sx, will treat now for UTI. Culture sent  Return for As scheduled.

## 2015-04-10 LAB — URINE CULTURE

## 2015-04-12 ENCOUNTER — Telehealth: Payer: Self-pay | Admitting: Advanced Practice Midwife

## 2015-04-12 NOTE — Telephone Encounter (Signed)
Pt aware of urine culture results (WNL) from 04/08/2015. Pt states taking the abx for UTI as Cathie BeamsFran Cresenzo-Dishmon, CNM prescribed and has an appt this Wednesday, 04/14/2015.

## 2015-04-14 ENCOUNTER — Encounter: Payer: Self-pay | Admitting: Women's Health

## 2015-04-14 ENCOUNTER — Ambulatory Visit (INDEPENDENT_AMBULATORY_CARE_PROVIDER_SITE_OTHER): Payer: BLUE CROSS/BLUE SHIELD | Admitting: Women's Health

## 2015-04-14 VITALS — BP 114/72 | HR 84 | Wt 205.0 lb

## 2015-04-14 DIAGNOSIS — Z3492 Encounter for supervision of normal pregnancy, unspecified, second trimester: Secondary | ICD-10-CM

## 2015-04-14 DIAGNOSIS — Z331 Pregnant state, incidental: Secondary | ICD-10-CM

## 2015-04-14 DIAGNOSIS — Z1389 Encounter for screening for other disorder: Secondary | ICD-10-CM

## 2015-04-14 LAB — POCT URINALYSIS DIPSTICK
Blood, UA: NEGATIVE
Glucose, UA: NEGATIVE
Ketones, UA: NEGATIVE
LEUKOCYTES UA: NEGATIVE
Nitrite, UA: NEGATIVE
PROTEIN UA: NEGATIVE

## 2015-04-14 NOTE — Patient Instructions (Signed)

## 2015-04-14 NOTE — Progress Notes (Signed)
Low-risk OB appointment [id number] 3570w1d Estimated Date of Delivery: 10/19/15 BP 114/72 mmHg  Pulse 84  Wt 205 lb (92.987 kg)  LMP 01/12/2015 (Exact Date)  BP, weight, and urine reviewed.  Refer to obstetrical flow sheet for FH & FHR.  No fm yet. Denies cramping, lof, vb, or uti s/s. No complaints. Back feeling much better, urine cx was neg.  Declines genetic screening. Plans to get flu shot in 2wks w/ PCP.  Reviewed warning s/s to report. Plan:  Continue routine obstetrical care  F/U in 4wks for OB appointment

## 2015-05-02 NOTE — L&D Delivery Note (Signed)
.  nbwDelivery Note At 2:29 PM a viable and healthy female was delivered via SVD (Presentation: OP restituted to ROT).  APGAR: 9,9 ; weight   .   Placenta status: Intact, Spontaneous.  Cord: 3 vessel no complications.  Placenta and cord donated  Anesthesia: Epidural  Episiotomy: None Lacerations:  Bilateral superficial labial and 1st degree perineal Suture Repair: No repair Est. Blood Loss (mL):  300  Mom to postpartum.  Baby to Couplet care / Skin to Skin.  Olena LeatherwoodKelly M Rosebud Koenen 10/06/2015, 2:42 PM

## 2015-05-11 ENCOUNTER — Encounter: Payer: Self-pay | Admitting: Advanced Practice Midwife

## 2015-05-11 ENCOUNTER — Ambulatory Visit (INDEPENDENT_AMBULATORY_CARE_PROVIDER_SITE_OTHER): Payer: BLUE CROSS/BLUE SHIELD | Admitting: Advanced Practice Midwife

## 2015-05-11 VITALS — BP 100/60 | HR 82 | Wt 206.0 lb

## 2015-05-11 DIAGNOSIS — Z331 Pregnant state, incidental: Secondary | ICD-10-CM

## 2015-05-11 DIAGNOSIS — Z363 Encounter for antenatal screening for malformations: Secondary | ICD-10-CM

## 2015-05-11 DIAGNOSIS — Z1389 Encounter for screening for other disorder: Secondary | ICD-10-CM

## 2015-05-11 DIAGNOSIS — Z3492 Encounter for supervision of normal pregnancy, unspecified, second trimester: Secondary | ICD-10-CM

## 2015-05-11 DIAGNOSIS — R8271 Bacteriuria: Secondary | ICD-10-CM

## 2015-05-11 LAB — POCT URINALYSIS DIPSTICK
GLUCOSE UA: NEGATIVE
KETONES UA: NEGATIVE
Leukocytes, UA: NEGATIVE
Nitrite, UA: NEGATIVE
Protein, UA: NEGATIVE
RBC UA: NEGATIVE

## 2015-05-11 NOTE — Progress Notes (Signed)
[id number] 543w0d Estimated Date of Delivery: 10/19/15  Blood pressure 100/60, pulse 82, weight 206 lb (93.441 kg), last menstrual period 01/12/2015.   BP weight and urine results all reviewed and noted.  Please refer to the obstetrical flow sheet for the fundal height and fetal heart rate documentation:  Patient reports some fetal movement, denies any bleeding and no rupture of membranes symptoms or regular contractions. Patient is without complaints. All questions were answered.  Orders Placed This Encounter  Procedures  . US OB Comp + 14 Wk  . POCT urinalysis dipstick    Plan:  Continued routine obstetrical care,   Return in about 3 weeks (around 06/01/2015) for LROB, DG:[id number]:Anatomy.

## 2015-05-11 NOTE — Progress Notes (Signed)
Pt denies any problems or concerns at this time.  

## 2015-06-02 ENCOUNTER — Ambulatory Visit (INDEPENDENT_AMBULATORY_CARE_PROVIDER_SITE_OTHER): Payer: BLUE CROSS/BLUE SHIELD

## 2015-06-02 ENCOUNTER — Encounter: Payer: Self-pay | Admitting: Obstetrics and Gynecology

## 2015-06-02 ENCOUNTER — Ambulatory Visit (INDEPENDENT_AMBULATORY_CARE_PROVIDER_SITE_OTHER): Payer: BLUE CROSS/BLUE SHIELD | Admitting: Obstetrics and Gynecology

## 2015-06-02 VITALS — BP 110/60 | HR 76 | Wt 206.0 lb

## 2015-06-02 DIAGNOSIS — R8271 Bacteriuria: Secondary | ICD-10-CM

## 2015-06-02 DIAGNOSIS — Z1389 Encounter for screening for other disorder: Secondary | ICD-10-CM

## 2015-06-02 DIAGNOSIS — Z3A20 20 weeks gestation of pregnancy: Secondary | ICD-10-CM | POA: Diagnosis not present

## 2015-06-02 DIAGNOSIS — Z363 Encounter for antenatal screening for malformations: Secondary | ICD-10-CM

## 2015-06-02 DIAGNOSIS — Z3482 Encounter for supervision of other normal pregnancy, second trimester: Secondary | ICD-10-CM

## 2015-06-02 DIAGNOSIS — Z3492 Encounter for supervision of normal pregnancy, unspecified, second trimester: Secondary | ICD-10-CM

## 2015-06-02 DIAGNOSIS — Z36 Encounter for antenatal screening of mother: Secondary | ICD-10-CM | POA: Diagnosis not present

## 2015-06-02 DIAGNOSIS — Z331 Pregnant state, incidental: Secondary | ICD-10-CM

## 2015-06-02 LAB — POCT URINALYSIS DIPSTICK
GLUCOSE UA: NEGATIVE
Ketones, UA: NEGATIVE
LEUKOCYTES UA: NEGATIVE
NITRITE UA: NEGATIVE
Protein, UA: NEGATIVE
RBC UA: NEGATIVE

## 2015-06-02 NOTE — Progress Notes (Addendum)
Patient ID: Erika Johnston, female   DOB: 12-17-1985, 30 y.o.   MRN: [id number]  [id number] [redacted]w[redacted]d Estimated Date of Delivery: 10/19/15  Blood pressure 110/60, pulse 76, weight 206 lb (93.441 kg), last menstrual period 01/12/2015.   refer to the ob flow sheet for FH and FHR, also BP, Wt, Urine results:notable for negative  Patient reports + good fetal movement, denies any bleeding and no rupture of membranes symptoms or regular contractions. Patient complaints: Patient does note feeling chronically "tired" and "worn down" recently, but otherwise has no complaints.   FHR: 155 bpm FH: 24 cm U+3, Pupils : clear, mucus membranes not pale ,, not consistent with anemia.  Questions were answered. Assessment: LROB [id number] @ [redacted]w[redacted]d    20 wk anatomy scan completed today.   Reviewed CBC from 5 mo. Ago - Hemoglobin of 13.2, unlikely anemia. Suspect normal tiredness as pt is mother to two, young, active children at home. Will check TSH as part of 28wk labs. Pt okay with this plan.  Plan:  Continued routine obstetrical care,   F/u in 4 weeks for pnx care, TSH at 28wk   By signing my name below, I, Ronney Lion, attest that this documentation has been prepared under the direction and in the presence of Tilda Burrow, MD. Electronically Signed: Ronney Lion, ED Scribe. 06/02/2015. 12:34 PM.  I personally performed the services described in this documentation, which was SCRIBED in my presence. The recorded information has been reviewed and considered accurate. It has been edited as necessary during review. Tilda Burrow, MD

## 2015-06-02 NOTE — Progress Notes (Signed)
Patient ID: Erika Johnston, female   DOB: 03/06/1986, 30 y.o.   MRN: [id number] ° °G5P2022 [redacted]w[redacted]d Estimated Date of Delivery: 10/19/15  °Blood pressure 110/60, pulse 76, weight 206 lb (93.441 kg), last menstrual period 01/12/2015.  ° °refer to the ob flow sheet for FH and FHR, also BP, Wt, Urine results:notable for negative ° °Patient reports + good fetal movement, denies any bleeding and no rupture of membranes symptoms or regular contractions. °Patient complaints: Patient does note feeling chronically "tired" and "worn down" recently, but otherwise has no complaints.  ° °FHR: 155 bpm °FH: 24 cm U+3, °Pupils : clear, mucus membranes not pale ,, not consistent with anemia. ° °Questions were answered. °Assessment: LROB G5P2022 @ [redacted]w[redacted]d  °  20 wk anatomy scan completed today. °  Reviewed CBC from 5 mo. Ago - Hemoglobin of 13.2, unlikely anemia. Suspect normal tiredness as pt is mother to two, young, active children at home. Will check TSH as part of 28wk labs. Pt okay with this plan. ° °Plan:  Continued routine obstetrical care,  ° °F/u in 4 weeks for pnx care, TSH at 28wk ° ° °By signing my name below, I, Suzanne Le, attest that this documentation has been prepared under the direction and in the presence of Waldemar Siegel V, MD. °Electronically Signed: Suzanne Le, ED Scribe. 06/02/2015. 12:34 PM. ° °I personally performed the services described in this documentation, which was SCRIBED in my presence. The recorded information has been reviewed and considered accurate. It has been edited as necessary during review. °Lyndie Vanderloop V, MD ° °   °

## 2015-06-02 NOTE — Progress Notes (Signed)
Pt states that she is exhausted all the time.

## 2015-06-02 NOTE — Progress Notes (Signed)
[place] 20+1wks,breech,cx 5.2cm,normal ov's bilat,post pl,svp of fluid 5 cm,fhr 155 bpm,right renal pelvis 3 mm (wnl),normal LK, efw 359g,anatomy complete,no obvious abn seen

## 2015-07-05 ENCOUNTER — Encounter: Payer: Self-pay | Admitting: Women's Health

## 2015-07-05 ENCOUNTER — Ambulatory Visit (INDEPENDENT_AMBULATORY_CARE_PROVIDER_SITE_OTHER): Payer: BLUE CROSS/BLUE SHIELD | Admitting: Women's Health

## 2015-07-05 VITALS — BP 108/62 | HR 84 | Wt 213.0 lb

## 2015-07-05 DIAGNOSIS — Z1389 Encounter for screening for other disorder: Secondary | ICD-10-CM

## 2015-07-05 DIAGNOSIS — Z3492 Encounter for supervision of normal pregnancy, unspecified, second trimester: Secondary | ICD-10-CM

## 2015-07-05 DIAGNOSIS — Z331 Pregnant state, incidental: Secondary | ICD-10-CM

## 2015-07-05 DIAGNOSIS — Z3482 Encounter for supervision of other normal pregnancy, second trimester: Secondary | ICD-10-CM

## 2015-07-05 DIAGNOSIS — Z3A25 25 weeks gestation of pregnancy: Secondary | ICD-10-CM

## 2015-07-05 LAB — POCT URINALYSIS DIPSTICK
Blood, UA: NEGATIVE
GLUCOSE UA: NEGATIVE
Ketones, UA: NEGATIVE
LEUKOCYTES UA: NEGATIVE
NITRITE UA: NEGATIVE
PROTEIN UA: NEGATIVE

## 2015-07-05 NOTE — Progress Notes (Signed)
Low-risk OB appointment [id number] 2281w6d Estimated Date of Delivery: 10/19/15 BP 108/62 mmHg  Pulse 84  Wt 213 lb (96.616 kg)  LMP 01/12/2015 (Exact Date)  BP, weight, and urine reviewed.  Refer to obstetrical flow sheet for FH & FHR.  Reports good fm.  Denies regular uc's, lof, vb, or uti s/s. Constipation- not using any otc meds- discussed and gave printed info. Needs PN2 in 2wks instead of 4wks d/t childcare, husband losing job.  Reviewed ptl s/s, fm. Plan:  Continue routine obstetrical care  F/U in 2wks for OB appointment and pn2 (per request to do 2wks early)

## 2015-07-05 NOTE — Patient Instructions (Signed)
Constipation  Drink plenty of fluid, preferably water, throughout the day  Eat foods high in fiber such as fruits, vegetables, and grains  Exercise, such as walking, is a good way to keep your bowels regular  Drink warm fluids, especially warm prune juice, or decaf coffee  Eat a 1/2 cup of real oatmeal (not instant), 1/2 cup applesauce, and 1/2-1 cup warm prune juice every day  If needed, you may take Colace (docusate sodium) stool softener once or twice a day to help keep the stool soft. If you are pregnant, wait until you are out of your first trimester (12-14 weeks of pregnancy)  If you still are having problems with constipation, you may take Miralax once daily as needed to help keep your bowels regular.  If you are pregnant, wait until you are out of your first trimester (12-14 weeks of pregnancy)    You will have your sugar test next visit.  Please do not eat or drink anything after midnight the night before you come, not even water.  You will be here for at least two hours.     Call the office [phone]) or go to [facility] if:  You begin to have strong, frequent contractions  Your water breaks.  Sometimes it is a big gush of fluid, sometimes it is just a trickle that keeps getting your panties wet or running down your legs  You have vaginal bleeding.  It is normal to have a small amount of spotting if your cervix was checked.   You don't feel your baby moving like normal.  If you don't, get you something to eat and drink and lay down and focus on feeling your baby move.   If your baby is still not moving like normal, you should call the office or go to [facility].  Second Trimester of Pregnancy The second trimester is from week 13 through week 28, months 4 through 6. The second trimester is often a time when you feel your best. Your body has also adjusted to being pregnant, and you begin to feel better physically. Usually, morning sickness has lessened or quit  completely, you may have more energy, and you may have an increase in appetite. The second trimester is also a time when the fetus is growing rapidly. At the end of the sixth month, the fetus is about 9 inches long and weighs about 1 pounds. You will likely begin to feel the baby move (quickening) between 18 and 20 weeks of the pregnancy. BODY CHANGES Your body goes through many changes during pregnancy. The changes vary from woman to woman.   Your weight will continue to increase. You will notice your lower abdomen bulging out.  You may begin to get stretch marks on your hips, abdomen, and breasts.  You may develop headaches that can be relieved by medicines approved by your health care provider.  You may urinate more often because the fetus is pressing on your bladder.  You may develop or continue to have heartburn as a result of your pregnancy.  You may develop constipation because certain hormones are causing the muscles that push waste through your intestines to slow down.  You may develop hemorrhoids or swollen, bulging veins (varicose veins).  You may have back pain because of the weight gain and pregnancy hormones relaxing your joints between the bones in your pelvis and as a result of a shift in weight and the muscles that support your balance.  Your breasts will continue  to grow and be tender.  Your gums may bleed and may be sensitive to brushing and flossing.  Dark spots or blotches (chloasma, mask of pregnancy) may develop on your face. This will likely fade after the baby is born.  A dark line from your belly button to the pubic area (linea nigra) may appear. This will likely fade after the baby is born.  You may have changes in your hair. These can include thickening of your hair, rapid growth, and changes in texture. Some women also have hair loss during or after pregnancy, or hair that feels dry or thin. Your hair will most likely return to normal after your baby is  born. WHAT TO EXPECT AT YOUR PRENATAL VISITS During a routine prenatal visit:  You will be weighed to make sure you and the fetus are growing normally.  Your blood pressure will be taken.  Your abdomen will be measured to track your baby's growth.  The fetal heartbeat will be listened to.  Any test results from the previous visit will be discussed. Your health care provider may ask you:  How you are feeling.  If you are feeling the baby move.  If you have had any abnormal symptoms, such as leaking fluid, bleeding, severe headaches, or abdominal cramping.  If you have any questions. Other tests that may be performed during your second trimester include:  Blood tests that check for:  Low iron levels (anemia).  Gestational diabetes (between 24 and 28 weeks).  Rh antibodies.  Urine tests to check for infections, diabetes, or protein in the urine.  An ultrasound to confirm the proper growth and development of the baby.  An amniocentesis to check for possible genetic problems.  Fetal screens for spina bifida and Down syndrome. HOME CARE INSTRUCTIONS   Avoid all smoking, herbs, alcohol, and unprescribed drugs. These chemicals affect the formation and growth of the baby.  Follow your health care provider's instructions regarding medicine use. There are medicines that are either safe or unsafe to take during pregnancy.  Exercise only as directed by your health care provider. Experiencing uterine cramps is a good sign to stop exercising.  Continue to eat regular, healthy meals.  Wear a good support bra for breast tenderness.  Do not use hot tubs, steam rooms, or saunas.  Wear your seat belt at all times when driving.  Avoid raw meat, uncooked cheese, cat litter boxes, and soil used by cats. These carry germs that can cause birth defects in the baby.  Take your prenatal vitamins.  Try taking a stool softener (if your health care provider approves) if you develop  constipation. Eat more high-fiber foods, such as fresh vegetables or fruit and whole grains. Drink plenty of fluids to keep your urine clear or pale yellow.  Take warm sitz baths to soothe any pain or discomfort caused by hemorrhoids. Use hemorrhoid cream if your health care provider approves.  If you develop varicose veins, wear support hose. Elevate your feet for 15 minutes, 3-4 times a day. Limit salt in your diet.  Avoid heavy lifting, wear low heel shoes, and practice good posture.  Rest with your legs elevated if you have leg cramps or low back pain.  Visit your dentist if you have not gone yet during your pregnancy. Use a soft toothbrush to brush your teeth and be gentle when you floss.  A sexual relationship may be continued unless your health care provider directs you otherwise.  Continue to go to all your  prenatal visits as directed by your health care provider. SEEK MEDICAL CARE IF:   You have dizziness.  You have mild pelvic cramps, pelvic pressure, or nagging pain in the abdominal area.  You have persistent nausea, vomiting, or diarrhea.  You have a bad smelling vaginal discharge.  You have pain with urination. SEEK IMMEDIATE MEDICAL CARE IF:   You have a fever.  You are leaking fluid from your vagina.  You have spotting or bleeding from your vagina.  You have severe abdominal cramping or pain.  You have rapid weight gain or loss.  You have shortness of breath with chest pain.  You notice sudden or extreme swelling of your face, hands, ankles, feet, or legs.  You have not felt your baby move in over an hour.  You have severe headaches that do not go away with medicine.  You have vision changes. Document Released: 04/11/2001 Document Revised: 04/22/2013 Document Reviewed: 06/18/2012 [facility] Patient Information 2015 [place], [place]. This information is not intended to replace advice given to you by your health care provider. Make sure you discuss any  questions you have with your health care provider.

## 2015-07-19 ENCOUNTER — Encounter: Payer: BLUE CROSS/BLUE SHIELD | Admitting: Women's Health

## 2015-07-19 ENCOUNTER — Other Ambulatory Visit: Payer: BLUE CROSS/BLUE SHIELD

## 2015-07-20 ENCOUNTER — Encounter: Payer: Self-pay | Admitting: Women's Health

## 2015-07-20 ENCOUNTER — Ambulatory Visit (INDEPENDENT_AMBULATORY_CARE_PROVIDER_SITE_OTHER): Payer: BLUE CROSS/BLUE SHIELD | Admitting: Women's Health

## 2015-07-20 ENCOUNTER — Other Ambulatory Visit: Payer: BLUE CROSS/BLUE SHIELD

## 2015-07-20 VITALS — BP 102/60 | HR 80 | Wt 215.5 lb

## 2015-07-20 DIAGNOSIS — Z1389 Encounter for screening for other disorder: Secondary | ICD-10-CM

## 2015-07-20 DIAGNOSIS — Z3482 Encounter for supervision of other normal pregnancy, second trimester: Secondary | ICD-10-CM | POA: Diagnosis not present

## 2015-07-20 DIAGNOSIS — Z3492 Encounter for supervision of normal pregnancy, unspecified, second trimester: Secondary | ICD-10-CM

## 2015-07-20 DIAGNOSIS — O09292 Supervision of pregnancy with other poor reproductive or obstetric history, second trimester: Secondary | ICD-10-CM

## 2015-07-20 DIAGNOSIS — Z3A27 27 weeks gestation of pregnancy: Secondary | ICD-10-CM | POA: Diagnosis not present

## 2015-07-20 DIAGNOSIS — R5383 Other fatigue: Secondary | ICD-10-CM

## 2015-07-20 DIAGNOSIS — Z331 Pregnant state, incidental: Secondary | ICD-10-CM

## 2015-07-20 DIAGNOSIS — Z131 Encounter for screening for diabetes mellitus: Secondary | ICD-10-CM

## 2015-07-20 DIAGNOSIS — O09299 Supervision of pregnancy with other poor reproductive or obstetric history, unspecified trimester: Secondary | ICD-10-CM | POA: Insufficient documentation

## 2015-07-20 DIAGNOSIS — Z369 Encounter for antenatal screening, unspecified: Secondary | ICD-10-CM

## 2015-07-20 LAB — POCT URINALYSIS DIPSTICK
Glucose, UA: NEGATIVE
KETONES UA: NEGATIVE
Leukocytes, UA: NEGATIVE
Nitrite, UA: NEGATIVE
RBC UA: NEGATIVE

## 2015-07-20 NOTE — Progress Notes (Signed)
Low-risk OB appointment [id number] 2956w0d Estimated Date of Delivery: 10/19/15 BP 102/60 mmHg  Pulse 80  Wt 215 lb 8 oz (97.75 kg)  LMP 01/12/2015 (Exact Date)  BP, weight, and urine reviewed.  Refer to obstetrical flow sheet for FH & FHR.  Reports good fm.  Denies regular uc's, lof, vb, or uti s/s. No complaints. Reviewed ptl s/s, fkc. Recommended Tdap at HD/PCP per CDC guidelines.  Plan:  Continue routine obstetrical care  F/U in 3wks for OB appointment  PN2 today

## 2015-07-20 NOTE — Patient Instructions (Signed)

## 2015-07-21 LAB — CBC
HEMATOCRIT: 36.5 % (ref 34.0–46.6)
HEMOGLOBIN: 12.3 g/dL (ref 11.1–15.9)
MCH: 31.5 pg (ref 26.6–33.0)
MCHC: 33.7 g/dL (ref 31.5–35.7)
MCV: 94 fL (ref 79–97)
Platelets: 180 10*3/uL (ref 150–379)
RBC: 3.9 x10E6/uL (ref 3.77–5.28)
RDW: 13.6 % (ref 12.3–15.4)
WBC: 11.4 10*3/uL — ABNORMAL HIGH (ref 3.4–10.8)

## 2015-07-21 LAB — ANTIBODY SCREEN: ANTIBODY SCREEN: NEGATIVE

## 2015-07-21 LAB — HIV ANTIBODY (ROUTINE TESTING W REFLEX): HIV Screen 4th Generation wRfx: NONREACTIVE

## 2015-07-21 LAB — GLUCOSE TOLERANCE, 2 HOURS W/ 1HR
GLUCOSE, 2 HOUR: 102 mg/dL (ref 65–152)
Glucose, 1 hour: 168 mg/dL (ref 65–179)
Glucose, Fasting: 74 mg/dL (ref 65–91)

## 2015-07-21 LAB — TSH: TSH: 2.12 u[IU]/mL (ref 0.450–4.500)

## 2015-07-21 LAB — RPR: RPR: NONREACTIVE

## 2015-07-31 DIAGNOSIS — Z3482 Encounter for supervision of other normal pregnancy, second trimester: Secondary | ICD-10-CM

## 2015-07-31 DIAGNOSIS — Z3A29 29 weeks gestation of pregnancy: Secondary | ICD-10-CM

## 2015-08-10 ENCOUNTER — Ambulatory Visit (INDEPENDENT_AMBULATORY_CARE_PROVIDER_SITE_OTHER): Payer: Medicaid Other | Admitting: Women's Health

## 2015-08-10 VITALS — BP 110/70 | HR 90 | Wt 219.0 lb

## 2015-08-10 DIAGNOSIS — Z3A30 30 weeks gestation of pregnancy: Secondary | ICD-10-CM

## 2015-08-10 DIAGNOSIS — Z1389 Encounter for screening for other disorder: Secondary | ICD-10-CM | POA: Diagnosis not present

## 2015-08-10 DIAGNOSIS — Z3483 Encounter for supervision of other normal pregnancy, third trimester: Secondary | ICD-10-CM | POA: Diagnosis not present

## 2015-08-10 DIAGNOSIS — Z331 Pregnant state, incidental: Secondary | ICD-10-CM | POA: Diagnosis not present

## 2015-08-10 DIAGNOSIS — Z3493 Encounter for supervision of normal pregnancy, unspecified, third trimester: Secondary | ICD-10-CM

## 2015-08-10 LAB — POCT URINALYSIS DIPSTICK
Blood, UA: NEGATIVE
Glucose, UA: NEGATIVE
Ketones, UA: NEGATIVE
LEUKOCYTES UA: NEGATIVE
NITRITE UA: NEGATIVE
PROTEIN UA: NEGATIVE

## 2015-08-10 NOTE — Progress Notes (Signed)
Low-risk OB appointment [id number] 873w0d Estimated Date of Delivery: 10/19/15 BP 110/70 mmHg  Pulse 90  Wt 219 lb (99.338 kg)  LMP 01/12/2015 (Exact Date)  BP, weight, and urine reviewed.  Refer to obstetrical flow sheet for FH & FHR.  Reports good fm.  Denies regular uc's, lof, vb, or uti s/s. No complaints. Traveling to [place] this weekend- takes records w/ her. Get out q 1hr to walk around, exercise legs when in car, don't cross legs.  Reviewed ptl s/s, fkc. Plan:  Continue routine obstetrical care  F/U in 2wks for OB appointment

## 2015-08-10 NOTE — Patient Instructions (Signed)
Call the office ([phone]) or go to [facility] if:  You begin to have strong, frequent contractions  Your water breaks.  Sometimes it is a big gush of fluid, sometimes it is just a trickle that keeps getting your panties wet or running down your legs  You have vaginal bleeding.  It is normal to have a small amount of spotting if your cervix was checked.   You don't feel your baby moving like normal.  If you don't, get you something to eat and drink and lay down and focus on feeling your baby move.  You should feel at least 10 movements in 2 hours.  If you don't, you should call the office or go to [facility].    Preterm Labor Information Preterm labor is when labor starts at less than 37 weeks of pregnancy. The normal length of a pregnancy is 39 to 41 weeks. CAUSES Often, there is no identifiable underlying cause as to why a woman goes into preterm labor. One of the most common known causes of preterm labor is infection. Infections of the uterus, cervix, vagina, amniotic sac, bladder, kidney, or even the lungs (pneumonia) can cause labor to start. Other suspected causes of preterm labor include:   Urogenital infections, such as yeast infections and bacterial vaginosis.   Uterine abnormalities (uterine shape, uterine septum, fibroids, or bleeding from the placenta).   A cervix that has been operated on (it may fail to stay closed).   Malformations in the fetus.   Multiple gestations (twins, triplets, and so on).   Breakage of the amniotic sac.  RISK FACTORS  Having a previous history of preterm labor.   Having premature rupture of membranes (PROM).   Having a placenta that covers the opening of the cervix (placenta previa).   Having a placenta that separates from the uterus (placental abruption).   Having a cervix that is too weak to hold the fetus in the uterus (incompetent cervix).   Having too much fluid in the amniotic sac (polyhydramnios).   Taking  illegal drugs or smoking while pregnant.   Not gaining enough weight while pregnant.   Being younger than 18 and older than 30 years old.   Having a low socioeconomic status.   Being African American. SYMPTOMS Signs and symptoms of preterm labor include:   Menstrual-like cramps, abdominal pain, or back pain.  Uterine contractions that are regular, as frequent as six in an hour, regardless of their intensity (may be mild or painful).  Contractions that start on the top of the uterus and spread down to the lower abdomen and back.   A sense of increased pelvic pressure.   A watery or bloody mucus discharge that comes from the vagina.  TREATMENT Depending on the length of the pregnancy and other circumstances, your health care provider may suggest bed rest. If necessary, there are medicines that can be given to stop contractions and to mature the fetal lungs. If labor happens before 34 weeks of pregnancy, a prolonged hospital stay may be recommended. Treatment depends on the condition of both you and the fetus.  WHAT SHOULD YOU DO IF YOU THINK YOU ARE IN PRETERM LABOR? Call your health care provider right away. You will need to go to the hospital to get checked immediately. HOW CAN YOU PREVENT PRETERM LABOR IN FUTURE PREGNANCIES? You should:   Stop smoking if you smoke.  Maintain healthy weight gain and avoid chemicals and drugs that are not necessary.  Be watchful for   any type of infection.  Inform your health care provider if you have a known history of preterm labor.   This information is not intended to replace advice given to you by your health care provider. Make sure you discuss any questions you have with your health care provider.   Document Released: 07/08/2003 Document Revised: 12/18/2012 Document Reviewed: 05/20/2012 Elsevier Interactive Patient Education 2016 [employer]  

## 2015-08-24 ENCOUNTER — Ambulatory Visit (INDEPENDENT_AMBULATORY_CARE_PROVIDER_SITE_OTHER): Payer: Medicaid Other | Admitting: Women's Health

## 2015-08-24 ENCOUNTER — Other Ambulatory Visit (INDEPENDENT_AMBULATORY_CARE_PROVIDER_SITE_OTHER): Payer: BLUE CROSS/BLUE SHIELD

## 2015-08-24 VITALS — BP 104/60 | HR 100 | Wt 224.0 lb

## 2015-08-24 DIAGNOSIS — R8271 Bacteriuria: Secondary | ICD-10-CM

## 2015-08-24 DIAGNOSIS — O26843 Uterine size-date discrepancy, third trimester: Secondary | ICD-10-CM

## 2015-08-24 DIAGNOSIS — Z3A32 32 weeks gestation of pregnancy: Secondary | ICD-10-CM

## 2015-08-24 DIAGNOSIS — Z3483 Encounter for supervision of other normal pregnancy, third trimester: Secondary | ICD-10-CM

## 2015-08-24 DIAGNOSIS — Z3493 Encounter for supervision of normal pregnancy, unspecified, third trimester: Secondary | ICD-10-CM

## 2015-08-24 DIAGNOSIS — Z331 Pregnant state, incidental: Secondary | ICD-10-CM | POA: Diagnosis not present

## 2015-08-24 DIAGNOSIS — Z1389 Encounter for screening for other disorder: Secondary | ICD-10-CM | POA: Diagnosis not present

## 2015-08-24 LAB — POCT URINALYSIS DIPSTICK
Blood, UA: NEGATIVE
GLUCOSE UA: NEGATIVE
KETONES UA: NEGATIVE
Leukocytes, UA: NEGATIVE
Nitrite, UA: NEGATIVE
Protein, UA: NEGATIVE

## 2015-08-24 NOTE — Progress Notes (Signed)
US 32 wks,cephalic,post pl gr 2,afi 17 cm,fhr 156 bpm,normal ov's bilat,efw 2456g 82%

## 2015-08-24 NOTE — Progress Notes (Signed)
Low-risk OB appointment [id number] 425w0d Estimated Date of Delivery: 10/19/15 BP 104/60 mmHg  Pulse 100  Wt 224 lb (101.606 kg)  LMP 01/12/2015 (Exact Date)  BP, weight, and urine reviewed.  Refer to obstetrical flow sheet for FH & FHR.  Reports good fm.  Denies regular uc's, lof, vb, or uti s/s. Gallbladder acting up, did it w/ last pregnancy, is fine when she follows low-fat diet, but hasn't been recently. Possibly interested in having it removed pp. Gave printed info on low-fat diet, let us know if not helping.  Reviewed ptl s/s, fkc. Plan:  Continue routine obstetrical care  F/U today for efw/afi u/s (s>d), then 2wks for OB appointment

## 2015-08-24 NOTE — Patient Instructions (Signed)
Call the office [phone]) or go to [facility] if:  You begin to have strong, frequent contractions  Your water breaks.  Sometimes it is a big gush of fluid, sometimes it is just a trickle that keeps getting your panties wet or running down your legs  You have vaginal bleeding.  It is normal to have a small amount of spotting if your cervix was checked.   You don't feel your baby moving like normal.  If you don't, get you something to eat and drink and lay down and focus on feeling your baby move.  You should feel at least 10 movements in 2 hours.  If you don't, you should call the office or go to [facility].    Low-Fat Diet for Pancreatitis or Gallbladder Conditions A low-fat diet can be helpful if you have pancreatitis or a gallbladder condition. With these conditions, your pancreas and gallbladder have trouble digesting fats. A healthy eating plan with less fat will help rest your pancreas and gallbladder and reduce your symptoms. WHAT DO I NEED TO KNOW ABOUT THIS DIET?  Eat a low-fat diet.  Reduce your fat intake to less than 20-30% of your total daily calories. This is less than 50-60 g of fat per day.  Remember that you need some fat in your diet. Ask your dietician what your daily goal should be.  Choose nonfat and low-fat healthy foods. Look for the words "nonfat," "low fat," or "fat free."  As a guide, look on the label and choose foods with less than 3 g of fat per serving. Eat only one serving.  Avoid alcohol.  Do not smoke. If you need help quitting, talk with your health care provider.  Eat small frequent meals instead of three large heavy meals. WHAT FOODS CAN I EAT? Grains Include healthy grains and starches such as potatoes, wheat bread, fiber-rich cereal, and brown rice. Choose whole grain options whenever possible. In adults, whole grains should account for 45-65% of your daily calories.  Fruits and Vegetables Eat plenty of fruits and vegetables. Fresh  fruits and vegetables add fiber to your diet. Meats and Other Protein Sources Eat lean meat such as chicken and pork. Trim any fat off of meat before cooking it. Eggs, fish, and beans are other sources of protein. In adults, these foods should account for 10-35% of your daily calories. Dairy Choose low-fat milk and dairy options. Dairy includes fat and protein, as well as calcium.  Fats and Oils Limit high-fat foods such as fried foods, sweets, baked goods, sugary drinks.  Other Creamy sauces and condiments, such as mayonnaise, can add extra fat. Think about whether or not you need to use them, or use smaller amounts or low fat options. WHAT FOODS ARE NOT RECOMMENDED?  High fat foods, such as:  [employer]aked goods.  Ice cream.  [place] toast.  Sweet rolls.  Pizza.  Cheese bread.  Foods covered with batter, butter, creamy sauces, or cheese.  Fried foods.  Sugary drinks and desserts.  Foods that cause gas or bloating   This information is not intended to replace advice given to you by your health care provider. Make sure you discuss any questions you have with your health care provider.   Document Released: 04/22/2013 Document Reviewed: 04/22/2013 Elsevier Interactive Patient Education [employer]2016 Elsevier Inc.  Preterm Labor Information Preterm labor is when labor starts at less than 37 weeks of pregnancy. The normal length of a pregnancy is 39 to 41 weeks. CAUSES Often, there  is no identifiable underlying cause as to why a woman goes into preterm labor. One of the most common known causes of preterm labor is infection. Infections of the uterus, cervix, vagina, amniotic sac, bladder, kidney, or even the lungs (pneumonia) can cause labor to start. Other suspected causes of preterm labor include:   Urogenital infections, such as yeast infections and bacterial vaginosis.   Uterine abnormalities (uterine shape, uterine septum, fibroids, or bleeding from the placenta).   A cervix that has  been operated on (it may fail to stay closed).   Malformations in the fetus.   Multiple gestations (twins, triplets, and so on).   Breakage of the amniotic sac.  RISK FACTORS  Having a previous history of preterm labor.   Having premature rupture of membranes (PROM).   Having a placenta that covers the opening of the cervix (placenta previa).   Having a placenta that separates from the uterus (placental abruption).   Having a cervix that is too weak to hold the fetus in the uterus (incompetent cervix).   Having too much fluid in the amniotic sac (polyhydramnios).   Taking illegal drugs or smoking while pregnant.   Not gaining enough weight while pregnant.   Being younger than 66 and older than 30 years old.   Having a low socioeconomic status.   Being African American. SYMPTOMS Signs and symptoms of preterm labor include:   Menstrual-like cramps, abdominal pain, or back pain.  Uterine contractions that are regular, as frequent as six in an hour, regardless of their intensity (may be mild or painful).  Contractions that start on the top of the uterus and spread down to the lower abdomen and back.   A sense of increased pelvic pressure.   A watery or bloody mucus discharge that comes from the vagina.  TREATMENT Depending on the length of the pregnancy and other circumstances, your health care provider may suggest bed rest. If necessary, there are medicines that can be given to stop contractions and to mature the fetal lungs. If labor happens before 34 weeks of pregnancy, a prolonged hospital stay may be recommended. Treatment depends on the condition of both you and the fetus.  WHAT SHOULD YOU DO IF YOU THINK YOU ARE IN PRETERM LABOR? Call your health care provider right away. You will need to go to the hospital to get checked immediately. HOW CAN YOU PREVENT PRETERM LABOR IN FUTURE PREGNANCIES? You should:   Stop smoking if you smoke.  Maintain  healthy weight gain and avoid chemicals and drugs that are not necessary.  Be watchful for any type of infection.  Inform your health care provider if you have a known history of preterm labor.   This information is not intended to replace advice given to you by your health care provider. Make sure you discuss any questions you have with your health care provider.   Document Released: 07/08/2003 Document Revised: 12/18/2012 Document Reviewed: 05/20/2012 Elsevier Interactive Patient Education [employer]

## 2015-09-08 ENCOUNTER — Encounter: Payer: Self-pay | Admitting: Women's Health

## 2015-09-08 ENCOUNTER — Ambulatory Visit (INDEPENDENT_AMBULATORY_CARE_PROVIDER_SITE_OTHER): Payer: PRIVATE HEALTH INSURANCE | Admitting: Women's Health

## 2015-09-08 VITALS — BP 104/58 | HR 72 | Wt 222.0 lb

## 2015-09-08 DIAGNOSIS — Z3493 Encounter for supervision of normal pregnancy, unspecified, third trimester: Secondary | ICD-10-CM

## 2015-09-08 DIAGNOSIS — Z1389 Encounter for screening for other disorder: Secondary | ICD-10-CM | POA: Diagnosis not present

## 2015-09-08 DIAGNOSIS — O26843 Uterine size-date discrepancy, third trimester: Secondary | ICD-10-CM | POA: Diagnosis not present

## 2015-09-08 DIAGNOSIS — Z331 Pregnant state, incidental: Secondary | ICD-10-CM

## 2015-09-08 DIAGNOSIS — Z3A34 34 weeks gestation of pregnancy: Secondary | ICD-10-CM | POA: Diagnosis not present

## 2015-09-08 DIAGNOSIS — Z3483 Encounter for supervision of other normal pregnancy, third trimester: Secondary | ICD-10-CM | POA: Diagnosis not present

## 2015-09-08 LAB — POCT URINALYSIS DIPSTICK
Blood, UA: NEGATIVE
Glucose, UA: NEGATIVE
Ketones, UA: NEGATIVE
NITRITE UA: NEGATIVE
PROTEIN UA: NEGATIVE

## 2015-09-08 NOTE — Progress Notes (Signed)
Low-risk OB appointment [id number] 1261w1d Estimated Date of Delivery: 10/19/15 BP 104/58 mmHg  Pulse 72  Wt 222 lb (100.699 kg)  LMP 01/12/2015 (Exact Date)  BP, weight, and urine reviewed.  Refer to obstetrical flow sheet for FH & FHR.  Reports good fm.  Denies regular uc's, lof, vb, or uti s/s. No complaints. Gallbladder better.  Reviewed ptl s/s, fkc. Size still >d, will plan u/s at 37wks- d/t h/o macrosomia. Last efw 82%/afi normal 2wks ago.  Plan:  Continue routine obstetrical care  F/U in 2wks for OB appointment

## 2015-09-08 NOTE — Patient Instructions (Signed)
Call the office ([phone]) or go to [facility] if:  You begin to have strong, frequent contractions  Your water breaks.  Sometimes it is a big gush of fluid, sometimes it is just a trickle that keeps getting your panties wet or running down your legs  You have vaginal bleeding.  It is normal to have a small amount of spotting if your cervix was checked.   You don't feel your baby moving like normal.  If you don't, get you something to eat and drink and lay down and focus on feeling your baby move.  You should feel at least 10 movements in 2 hours.  If you don't, you should call the office or go to [facility].    Preterm Labor Information Preterm labor is when labor starts at less than 37 weeks of pregnancy. The normal length of a pregnancy is 39 to 41 weeks. CAUSES Often, there is no identifiable underlying cause as to why a woman goes into preterm labor. One of the most common known causes of preterm labor is infection. Infections of the uterus, cervix, vagina, amniotic sac, bladder, kidney, or even the lungs (pneumonia) can cause labor to start. Other suspected causes of preterm labor include:   Urogenital infections, such as yeast infections and bacterial vaginosis.   Uterine abnormalities (uterine shape, uterine septum, fibroids, or bleeding from the placenta).   A cervix that has been operated on (it may fail to stay closed).   Malformations in the fetus.   Multiple gestations (twins, triplets, and so on).   Breakage of the amniotic sac.  RISK FACTORS  Having a previous history of preterm labor.   Having premature rupture of membranes (PROM).   Having a placenta that covers the opening of the cervix (placenta previa).   Having a placenta that separates from the uterus (placental abruption).   Having a cervix that is too weak to hold the fetus in the uterus (incompetent cervix).   Having too much fluid in the amniotic sac (polyhydramnios).   Taking  illegal drugs or smoking while pregnant.   Not gaining enough weight while pregnant.   Being younger than 18 and older than 30 years old.   Having a low socioeconomic status.   Being African American. SYMPTOMS Signs and symptoms of preterm labor include:   Menstrual-like cramps, abdominal pain, or back pain.  Uterine contractions that are regular, as frequent as six in an hour, regardless of their intensity (may be mild or painful).  Contractions that start on the top of the uterus and spread down to the lower abdomen and back.   A sense of increased pelvic pressure.   A watery or bloody mucus discharge that comes from the vagina.  TREATMENT Depending on the length of the pregnancy and other circumstances, your health care provider may suggest bed rest. If necessary, there are medicines that can be given to stop contractions and to mature the fetal lungs. If labor happens before 34 weeks of pregnancy, a prolonged hospital stay may be recommended. Treatment depends on the condition of both you and the fetus.  WHAT SHOULD YOU DO IF YOU THINK YOU ARE IN PRETERM LABOR? Call your health care provider right away. You will need to go to the hospital to get checked immediately. HOW CAN YOU PREVENT PRETERM LABOR IN FUTURE PREGNANCIES? You should:   Stop smoking if you smoke.  Maintain healthy weight gain and avoid chemicals and drugs that are not necessary.  Be watchful for   any type of infection.  Inform your health care provider if you have a known history of preterm labor.   This information is not intended to replace advice given to you by your health care provider. Make sure you discuss any questions you have with your health care provider.   Document Released: 07/08/2003 Document Revised: 12/18/2012 Document Reviewed: 05/20/2012 Elsevier Interactive Patient Education 2016 [employer]  

## 2015-09-22 ENCOUNTER — Encounter: Payer: BLUE CROSS/BLUE SHIELD | Admitting: Women's Health

## 2015-09-22 ENCOUNTER — Encounter: Payer: Self-pay | Admitting: Women's Health

## 2015-09-22 ENCOUNTER — Ambulatory Visit (INDEPENDENT_AMBULATORY_CARE_PROVIDER_SITE_OTHER): Payer: PRIVATE HEALTH INSURANCE | Admitting: Women's Health

## 2015-09-22 VITALS — BP 108/64 | HR 80 | Wt 227.0 lb

## 2015-09-22 DIAGNOSIS — Z3483 Encounter for supervision of other normal pregnancy, third trimester: Secondary | ICD-10-CM

## 2015-09-22 DIAGNOSIS — Z3A37 37 weeks gestation of pregnancy: Secondary | ICD-10-CM

## 2015-09-22 DIAGNOSIS — Z1389 Encounter for screening for other disorder: Secondary | ICD-10-CM | POA: Diagnosis not present

## 2015-09-22 DIAGNOSIS — Z331 Pregnant state, incidental: Secondary | ICD-10-CM

## 2015-09-22 DIAGNOSIS — O368130 Decreased fetal movements, third trimester, not applicable or unspecified: Secondary | ICD-10-CM

## 2015-09-22 DIAGNOSIS — O26843 Uterine size-date discrepancy, third trimester: Secondary | ICD-10-CM | POA: Diagnosis not present

## 2015-09-22 DIAGNOSIS — O09293 Supervision of pregnancy with other poor reproductive or obstetric history, third trimester: Secondary | ICD-10-CM

## 2015-09-22 DIAGNOSIS — Z3493 Encounter for supervision of normal pregnancy, unspecified, third trimester: Secondary | ICD-10-CM

## 2015-09-22 DIAGNOSIS — L299 Pruritus, unspecified: Secondary | ICD-10-CM

## 2015-09-22 LAB — POCT URINALYSIS DIPSTICK
Blood, UA: NEGATIVE
Glucose, UA: NEGATIVE
Ketones, UA: NEGATIVE
Nitrite, UA: NEGATIVE
Protein, UA: NEGATIVE

## 2015-09-22 NOTE — Patient Instructions (Signed)
Benadryl by mouth or hydrocortisone cream, cool showers/wash cloths  Call the office [phone]) or go to [facility] if:  You begin to have strong, frequent contractions  Your water breaks.  Sometimes it is a big gush of fluid, sometimes it is just a trickle that keeps getting your panties wet or running down your legs  You have vaginal bleeding.  It is normal to have a small amount of spotting if your cervix was checked.   You don't feel your baby moving like normal.  If you don't, get you something to eat and drink and lay down and focus on feeling your baby move.  You should feel at least 10 movements in 2 hours.  If you don't, you should call the office or go to [facility].    Preterm Labor Information Preterm labor is when labor starts at less than 37 weeks of pregnancy. The normal length of a pregnancy is 39 to 41 weeks. CAUSES Often, there is no identifiable underlying cause as to why a woman goes into preterm labor. One of the most common known causes of preterm labor is infection. Infections of the uterus, cervix, vagina, amniotic sac, bladder, kidney, or even the lungs (pneumonia) can cause labor to start. Other suspected causes of preterm labor include:   Urogenital infections, such as yeast infections and bacterial vaginosis.   Uterine abnormalities (uterine shape, uterine septum, fibroids, or bleeding from the placenta).   A cervix that has been operated on (it may fail to stay closed).   Malformations in the fetus.   Multiple gestations (twins, triplets, and so on).   Breakage of the amniotic sac.  RISK FACTORS  Having a previous history of preterm labor.   Having premature rupture of membranes (PROM).   Having a placenta that covers the opening of the cervix (placenta previa).   Having a placenta that separates from the uterus (placental abruption).   Having a cervix that is too weak to hold the fetus in the uterus (incompetent cervix).    Having too much fluid in the amniotic sac (polyhydramnios).   Taking illegal drugs or smoking while pregnant.   Not gaining enough weight while pregnant.   Being younger than 6118 and older than 30 years old.   Having a low socioeconomic status.   Being African American. SYMPTOMS Signs and symptoms of preterm labor include:   Menstrual-like cramps, abdominal pain, or back pain.  Uterine contractions that are regular, as frequent as six in an hour, regardless of their intensity (may be mild or painful).  Contractions that start on the top of the uterus and spread down to the lower abdomen and back.   A sense of increased pelvic pressure.   A watery or bloody mucus discharge that comes from the vagina.  TREATMENT Depending on the length of the pregnancy and other circumstances, your health care provider may suggest bed rest. If necessary, there are medicines that can be given to stop contractions and to mature the fetal lungs. If labor happens before 34 weeks of pregnancy, a prolonged hospital stay may be recommended. Treatment depends on the condition of both you and the fetus.  WHAT SHOULD YOU DO IF YOU THINK YOU ARE IN PRETERM LABOR? Call your health care provider right away. You will need to go to the hospital to get checked immediately. HOW CAN YOU PREVENT PRETERM LABOR IN FUTURE PREGNANCIES? You should:   Stop smoking if you smoke.  Maintain healthy weight gain and avoid chemicals  and drugs that are not necessary.  Be watchful for any type of infection.  Inform your health care provider if you have a known history of preterm labor.   This information is not intended to replace advice given to you by your health care provider. Make sure you discuss any questions you have with your health care provider.   Document Released: 07/08/2003 Document Revised: 12/18/2012 Document Reviewed: 05/20/2012 Elsevier Interactive Patient Education [employer]2016 Elsevier Inc.

## 2015-09-22 NOTE — Progress Notes (Signed)
Low-risk OB appointment [id number] 2441w1d Estimated Date of Delivery: 10/19/15 BP 108/64 mmHg  Pulse 80  Wt 227 lb (102.967 kg)  LMP 01/12/2015 (Exact Date)  BP, weight, and urine reviewed.  Refer to obstetrical flow sheet for FH & FHR.  Reports decreased fm this am.  Denies regular uc's, lof, vb, or uti s/s. Rt sole of foot and Rt palm very itchy x 5 days. Will get bile acids & cmp.  NST: 135 baseline, reactive/Cat 1 Reviewed ptl s/s, fkc. Benadryl prn itching, hydrocortisone cream, cool showers/wash cloths- avoid hot showers/baths Plan:  Continue routine obstetrical care  F/U in 1wk for OB appointment and efw/afi for persistent s>d

## 2015-09-23 LAB — COMPREHENSIVE METABOLIC PANEL
A/G RATIO: 1.3 (ref 1.2–2.2)
ALT: 13 IU/L (ref 0–32)
AST: 12 IU/L (ref 0–40)
Albumin: 3.4 g/dL — ABNORMAL LOW (ref 3.5–5.5)
Alkaline Phosphatase: 133 IU/L — ABNORMAL HIGH (ref 39–117)
BUN/Creatinine Ratio: 8 — ABNORMAL LOW (ref 9–23)
BUN: 5 mg/dL — ABNORMAL LOW (ref 6–20)
CALCIUM: 8.7 mg/dL (ref 8.7–10.2)
CHLORIDE: 102 mmol/L (ref 96–106)
CO2: 21 mmol/L (ref 18–29)
Creatinine, Ser: 0.63 mg/dL (ref 0.57–1.00)
GFR, EST AFRICAN AMERICAN: 140 mL/min/{1.73_m2} (ref >59)
GFR, EST NON AFRICAN AMERICAN: 122 mL/min/{1.73_m2} (ref >59)
GLOBULIN, TOTAL: 2.6 g/dL (ref 1.5–4.5)
Glucose: 71 mg/dL (ref 65–99)
POTASSIUM: 4 mmol/L (ref 3.5–5.2)
SODIUM: 140 mmol/L (ref 134–144)
TOTAL PROTEIN: 6 g/dL (ref 6.0–8.5)

## 2015-09-23 LAB — BILE ACIDS, TOTAL: Bile Acids Total: 9 umol/L (ref 4.7–24.5)

## 2015-09-29 ENCOUNTER — Ambulatory Visit (INDEPENDENT_AMBULATORY_CARE_PROVIDER_SITE_OTHER): Payer: PRIVATE HEALTH INSURANCE

## 2015-09-29 ENCOUNTER — Encounter: Payer: Self-pay | Admitting: Women's Health

## 2015-09-29 ENCOUNTER — Ambulatory Visit (INDEPENDENT_AMBULATORY_CARE_PROVIDER_SITE_OTHER): Payer: Medicaid Other | Admitting: Women's Health

## 2015-09-29 VITALS — BP 104/60 | HR 80 | Wt 225.0 lb

## 2015-09-29 DIAGNOSIS — Z3493 Encounter for supervision of normal pregnancy, unspecified, third trimester: Secondary | ICD-10-CM

## 2015-09-29 DIAGNOSIS — Z3483 Encounter for supervision of other normal pregnancy, third trimester: Secondary | ICD-10-CM | POA: Diagnosis not present

## 2015-09-29 DIAGNOSIS — L299 Pruritus, unspecified: Secondary | ICD-10-CM

## 2015-09-29 DIAGNOSIS — R8271 Bacteriuria: Secondary | ICD-10-CM

## 2015-09-29 DIAGNOSIS — Z3685 Encounter for antenatal screening for Streptococcus B: Secondary | ICD-10-CM

## 2015-09-29 DIAGNOSIS — Z3A38 38 weeks gestation of pregnancy: Secondary | ICD-10-CM

## 2015-09-29 DIAGNOSIS — Z3A37 37 weeks gestation of pregnancy: Secondary | ICD-10-CM

## 2015-09-29 DIAGNOSIS — Z1159 Encounter for screening for other viral diseases: Secondary | ICD-10-CM

## 2015-09-29 DIAGNOSIS — O26843 Uterine size-date discrepancy, third trimester: Secondary | ICD-10-CM | POA: Diagnosis not present

## 2015-09-29 DIAGNOSIS — Z331 Pregnant state, incidental: Secondary | ICD-10-CM

## 2015-09-29 DIAGNOSIS — Z1389 Encounter for screening for other disorder: Secondary | ICD-10-CM | POA: Diagnosis not present

## 2015-09-29 DIAGNOSIS — O09293 Supervision of pregnancy with other poor reproductive or obstetric history, third trimester: Secondary | ICD-10-CM

## 2015-09-29 DIAGNOSIS — Z118 Encounter for screening for other infectious and parasitic diseases: Secondary | ICD-10-CM

## 2015-09-29 LAB — POCT URINALYSIS DIPSTICK
Blood, UA: NEGATIVE
Glucose, UA: NEGATIVE
Ketones, UA: NEGATIVE
LEUKOCYTES UA: NEGATIVE
NITRITE UA: NEGATIVE
PROTEIN UA: NEGATIVE

## 2015-09-29 NOTE — Patient Instructions (Addendum)
Nothing to eat/drink after midnight, come in am for labs  Call the office [phone]) or go to [facility] if:  You begin to have strong, frequent contractions  Your water breaks.  Sometimes it is a big gush of fluid, sometimes it is just a trickle that keeps getting your panties wet or running down your legs  You have vaginal bleeding.  It is normal to have a small amount of spotting if your cervix was checked.   You don't feel your baby moving like normal.  If you don't, get you something to eat and drink and lay down and focus on feeling your baby move.  You should feel at least 10 movements in 2 hours.  If you don't, you should call the office or go to [facility].    [facility] Contractions Contractions of the uterus can occur throughout pregnancy. Contractions are not always a sign that you are in labor.  WHAT ARE BRAXTON HICKS CONTRACTIONS?  Contractions that occur before labor are called Braxton Hicks contractions, or false labor. Toward the end of pregnancy (32-34 weeks), these contractions can develop more often and may become more forceful. This is not true labor because these contractions do not result in opening (dilatation) and thinning of the cervix. They are sometimes difficult to tell apart from true labor because these contractions can be forceful and people have different pain tolerances. You should not feel embarrassed if you go to the hospital with false labor. Sometimes, the only way to tell if you are in true labor is for your health care provider to look for changes in the cervix. If there are no prenatal problems or other health problems associated with the pregnancy, it is completely safe to be sent home with false labor and await the onset of true labor. HOW CAN YOU TELL THE DIFFERENCE BETWEEN TRUE AND FALSE LABOR? False Labor  The contractions of false labor are usually shorter and not as hard as those of true labor.   The contractions are usually  irregular.   The contractions are often felt in the front of the lower abdomen and in the groin.   The contractions may go away when you walk around or change positions while lying down.   The contractions get weaker and are shorter lasting as time goes on.   The contractions do not usually become progressively stronger, regular, and closer together as with true labor.  True Labor  Contractions in true labor last 30-70 seconds, become very regular, usually become more intense, and increase in frequency.   The contractions do not go away with walking.   The discomfort is usually felt in the top of the uterus and spreads to the lower abdomen and low back.   True labor can be determined by your health care provider with an exam. This will show that the cervix is dilating and getting thinner.  WHAT TO REMEMBER  Keep up with your usual exercises and follow other instructions given by your health care provider.   Take medicines as directed by your health care provider.   Keep your regular prenatal appointments.   Eat and drink lightly if you think you are going into labor.   If Braxton Hicks contractions are making you uncomfortable:   Change your position from lying down or resting to walking, or from walking to resting.   Sit and rest in a tub of warm water.   Drink 2-3 glasses of water. Dehydration may cause these contractions.  Do slow and deep breathing several times an hour.  WHEN SHOULD I SEEK IMMEDIATE MEDICAL CARE? Seek immediate medical care if:  Your contractions become stronger, more regular, and closer together.   You have fluid leaking or gushing from your vagina.   You have a fever.   You pass blood-tinged mucus.   You have vaginal bleeding.   You have continuous abdominal pain.   You have low back pain that you never had before.   You feel your baby's head pushing down and causing pelvic pressure.   Your baby is not moving  as much as it used to.    This information is not intended to replace advice given to you by your health care provider. Make sure you discuss any questions you have with your health care provider.   Document Released: 04/17/2005 Document Revised: 04/22/2013 Document Reviewed: 01/27/2013 Elsevier Interactive Patient Education [employer]2016 Elsevier Inc.

## 2015-09-29 NOTE — Progress Notes (Signed)
Low-risk OB appointment [id number] 611w1d Estimated Date of Delivery: 10/19/15 BP 104/60 mmHg  Pulse 80  Wt 225 lb (102.059 kg)  LMP 01/12/2015 (Exact Date)  BP, weight, and urine reviewed.  Refer to obstetrical flow sheet for FH & FHR.  Reports good fm.  Denies regular uc's, lof, vb, or uti s/s. Rt foot/palm still very itchy. Bile acids 9 last week, cmp normal. Not fasting today, will recheck this week/fasting. Doesn't want to take meds (benadryl/claritin).  GBS, gc/ct collected SVE per request: 3/th/ballotable, vtx Reviewed today's u/s for s>d h/o macrosomia, efw >90%/4052g, AFI 17cm. Discussed labor s/s, fkc Plan:  Continue routine obstetrical care  F/U this week for fasting bile acid/cmp, then 1wk for OB appointment

## 2015-09-29 NOTE — Progress Notes (Signed)
US 37+1 wks,cephalic,post pl gr 2,afi 17 cm,fhr 146 bpm,EFW 4052 g >90%,normal ov's bilat

## 2015-09-30 LAB — GC/CHLAMYDIA PROBE AMP
Chlamydia trachomatis, NAA: NEGATIVE
NEISSERIA GONORRHOEAE BY PCR: NEGATIVE

## 2015-10-01 LAB — STREP GP B NAA: STREP GROUP B AG: NEGATIVE

## 2015-10-01 LAB — BILE ACIDS, TOTAL: Bile Acids Total: 11.2 umol/L (ref 4.7–24.5)

## 2015-10-01 LAB — COMPREHENSIVE METABOLIC PANEL
A/G RATIO: 1.3 (ref 1.2–2.2)
ALBUMIN: 3.4 g/dL — AB (ref 3.5–5.5)
ALK PHOS: 135 IU/L — AB (ref 39–117)
ALT: 11 IU/L (ref 0–32)
AST: 12 IU/L (ref 0–40)
BUN / CREAT RATIO: 17 (ref 9–23)
BUN: 11 mg/dL (ref 6–20)
Bilirubin Total: <0.2 mg/dL (ref 0.0–1.2)
CO2: 20 mmol/L (ref 18–29)
CREATININE: 0.64 mg/dL (ref 0.57–1.00)
Calcium: 8.7 mg/dL (ref 8.7–10.2)
Chloride: 104 mmol/L (ref 96–106)
GFR calc Af Amer: 139 mL/min/{1.73_m2} (ref >59)
GFR, EST NON AFRICAN AMERICAN: 121 mL/min/{1.73_m2} (ref >59)
GLOBULIN, TOTAL: 2.6 g/dL (ref 1.5–4.5)
Glucose: 88 mg/dL (ref 65–99)
POTASSIUM: 4 mmol/L (ref 3.5–5.2)
SODIUM: 140 mmol/L (ref 134–144)
Total Protein: 6 g/dL (ref 6.0–8.5)

## 2015-10-05 ENCOUNTER — Telehealth: Payer: Self-pay | Admitting: Women's Health

## 2015-10-05 ENCOUNTER — Encounter: Payer: Self-pay | Admitting: Women's Health

## 2015-10-05 ENCOUNTER — Inpatient Hospital Stay (HOSPITAL_COMMUNITY)
Admission: AD | Admit: 2015-10-05 | Discharge: 2015-10-07 | DRG: 775 | Disposition: A | Discharge status: Home / Self Care | Payer: Medicaid Other | Source: Ambulatory Visit | Attending: Certified Nurse Midwife | Admitting: Certified Nurse Midwife

## 2015-10-05 ENCOUNTER — Encounter (HOSPITAL_COMMUNITY): Payer: Self-pay

## 2015-10-05 DIAGNOSIS — K831 Obstruction of bile duct: Secondary | ICD-10-CM | POA: Diagnosis present

## 2015-10-05 DIAGNOSIS — E669 Obesity, unspecified: Secondary | ICD-10-CM | POA: Diagnosis present

## 2015-10-05 DIAGNOSIS — O99214 Obesity complicating childbirth: Secondary | ICD-10-CM | POA: Diagnosis present

## 2015-10-05 DIAGNOSIS — Z6836 Body mass index (BMI) 36.0-36.9, adult: Secondary | ICD-10-CM

## 2015-10-05 DIAGNOSIS — O2662 Liver and biliary tract disorders in childbirth: Secondary | ICD-10-CM | POA: Diagnosis present

## 2015-10-05 DIAGNOSIS — O26643 Intrahepatic cholestasis of pregnancy, third trimester: Secondary | ICD-10-CM | POA: Diagnosis present

## 2015-10-05 DIAGNOSIS — Z833 Family history of diabetes mellitus: Secondary | ICD-10-CM | POA: Diagnosis not present

## 2015-10-05 DIAGNOSIS — O3663X0 Maternal care for excessive fetal growth, third trimester, not applicable or unspecified: Secondary | ICD-10-CM | POA: Diagnosis present

## 2015-10-05 DIAGNOSIS — R8271 Bacteriuria: Secondary | ICD-10-CM

## 2015-10-05 DIAGNOSIS — Z3A38 38 weeks gestation of pregnancy: Secondary | ICD-10-CM | POA: Diagnosis not present

## 2015-10-05 DIAGNOSIS — O26613 Liver and biliary tract disorders in pregnancy, third trimester: Secondary | ICD-10-CM

## 2015-10-05 DIAGNOSIS — Z3493 Encounter for supervision of normal pregnancy, unspecified, third trimester: Secondary | ICD-10-CM

## 2015-10-05 LAB — CBC
HCT: 35.5 % — ABNORMAL LOW (ref 36.0–46.0)
Hemoglobin: 12.1 g/dL (ref 12.0–15.0)
MCH: 31.6 pg (ref 26.0–34.0)
MCHC: 34.1 g/dL (ref 30.0–36.0)
MCV: 92.7 fL (ref 78.0–100.0)
PLATELETS: 129 10*3/uL — AB (ref 150–400)
RBC: 3.83 MIL/uL — ABNORMAL LOW (ref 3.87–5.11)
RDW: 14.2 % (ref 11.5–15.5)
WBC: 10 10*3/uL (ref 4.0–10.5)

## 2015-10-05 LAB — TYPE AND SCREEN
ABO/RH(D): O POS
ANTIBODY SCREEN: NEGATIVE

## 2015-10-05 LAB — ABO/RH: ABO/RH(D): O POS

## 2015-10-05 MED ORDER — GUAIFENESIN ER 600 MG PO TB12
600.0000 mg | ORAL_TABLET | Freq: Two times a day (BID) | ORAL | Status: DC | PRN
  Administered 2015-10-05 (×2): 600 mg via ORAL
  Filled 2015-10-05 (×3): qty 1

## 2015-10-05 MED ORDER — LIDOCAINE HCL (PF) 1 % IJ SOLN
30.0000 mL | INTRAMUSCULAR | Status: DC | PRN
  Filled 2015-10-05: qty 30

## 2015-10-05 MED ORDER — LACTATED RINGERS IV SOLN
500.0000 mL | INTRAVENOUS | Status: DC | PRN
  Administered 2015-10-06 (×2): 500 mL via INTRAVENOUS

## 2015-10-05 MED ORDER — LACTATED RINGERS IV SOLN
INTRAVENOUS | Status: DC
  Administered 2015-10-05: 15:00:00 via INTRAVENOUS

## 2015-10-05 MED ORDER — OXYCODONE-ACETAMINOPHEN 5-325 MG PO TABS
1.0000 | ORAL_TABLET | ORAL | Status: DC | PRN
  Filled 2015-10-05: qty 1

## 2015-10-05 MED ORDER — OXYTOCIN BOLUS FROM INFUSION: 500.0000 mL | INTRAVENOUS | Status: DC

## 2015-10-05 MED ORDER — OXYTOCIN 40 UNITS IN LACTATED RINGERS INFUSION - SIMPLE MED
1.0000 m[IU]/min | INTRAVENOUS | Status: DC
  Administered 2015-10-05: 2 m[IU]/min via INTRAVENOUS
  Filled 2015-10-05: qty 1000

## 2015-10-05 MED ORDER — MISOPROSTOL 25 MCG QUARTER TABLET
25.0000 ug | ORAL_TABLET | ORAL | Status: DC | PRN
  Filled 2015-10-05: qty 1

## 2015-10-05 MED ORDER — OXYCODONE-ACETAMINOPHEN 5-325 MG PO TABS: 2.0000 | ORAL_TABLET | ORAL | Status: DC | PRN

## 2015-10-05 MED ORDER — ACETAMINOPHEN 325 MG PO TABS: 650.0000 mg | ORAL_TABLET | ORAL | Status: DC | PRN

## 2015-10-05 MED ORDER — URSODIOL 300 MG PO CAPS
300.0000 mg | ORAL_CAPSULE | Freq: Three times a day (TID) | ORAL | Status: DC
Start: 2015-10-05 — End: 2015-10-06
  Administered 2015-10-05 – 2015-10-06 (×4): 300 mg via ORAL
  Filled 2015-10-05 (×6): qty 1

## 2015-10-05 MED ORDER — ONDANSETRON HCL 4 MG/2ML IJ SOLN
4.0000 mg | Freq: Four times a day (QID) | INTRAMUSCULAR | Status: DC | PRN
  Administered 2015-10-06: 4 mg via INTRAVENOUS
  Filled 2015-10-05: qty 2

## 2015-10-05 MED ORDER — TERBUTALINE SULFATE 1 MG/ML IJ SOLN
0.2500 mg | Freq: Once | INTRAMUSCULAR | Status: DC | PRN
  Filled 2015-10-05: qty 1

## 2015-10-05 MED ORDER — OXYTOCIN 40 UNITS IN LACTATED RINGERS INFUSION - SIMPLE MED
2.5000 [IU]/h | INTRAVENOUS | Status: DC
  Administered 2015-10-06: 2.5 [IU]/h via INTRAVENOUS
  Filled 2015-10-05: qty 1000

## 2015-10-05 MED ORDER — SOD CITRATE-CITRIC ACID 500-334 MG/5ML PO SOLN: 30.0000 mL | ORAL | Status: DC | PRN

## 2015-10-05 NOTE — Anesthesia Pain Management Evaluation Note (Signed)
  CRNA Pain Management Visit Note  Patient: Erika Johnston, 30 y.o., female  "Hello I am a member of the anesthesia team at [facility]. We have an anesthesia team available at all times to provide care throughout the hospital, including epidural management and anesthesia for C-section. I don't know your plan for the delivery whether it a natural birth, water birth, IV sedation, nitrous supplementation, doula or epidural, but we want to meet your pain goals."   1.Was your pain managed to your expectations on prior hospitalizations?   Yes   2.What is your expectation for pain management during this hospitalization?     Epidural  3.How can we help you reach that goal? epidural  Record the patient's initial score and the patient's pain goal.   Pain: 0  Pain Goal: 7 [facility] wants you to be able to say your pain was always managed very well.  Cephus ShellingBURGER,Ashlin Kreps 10/05/2015

## 2015-10-05 NOTE — H&P (Signed)
Erika Johnston is a 30 y.o. female presenting for IOL for cholestasis and LGA. Prenatal risk factors: Pt was sent by Ob PCP for cholestasis and ultrasound at 34W showing LGA with EFW 4052g >90%tile.  Pt had two prior NSVD births with perineal lacerations but no other lacerations, 1st child 10lbs and 2nd child 9 lbs. Does report some mild RUQ discomfort but otherwise tolerating PO intake well.  No pruritis reported.  ROS: Denies any VB, LOF, urinary symptoms, HA, CP, SOB, fever, n/v  History OB History    Gravida Para Term Preterm AB TAB SAB Ectopic Multiple Living   5 2 2  0 2 0 2 0 0 2     Past Medical History  Diagnosis Date  . No pertinent past medical history   . Pregnant 11/25/2014  . Miscarriage 12/09/2014   Past Surgical History  Procedure Laterality Date  . No past surgeries    . Colonoscopy  07/21/2011    ZOX:[id number]:Internal hemorrhoids: CAUSING RECTAL BLEEDING/Normal terminal ileum and normal colon   Family History: family history includes Cancer (age of onset: 1728) in her maternal grandmother; Diabetes in her paternal grandfather. There is no history of Anesthesia problems, Hypotension, Malignant hyperthermia, Pseudochol deficiency, or Colon cancer. She was adopted. Social History:  reports that she has never smoked. She has never used smokeless tobacco. She reports that she does not drink alcohol or use illicit drugs.   Prenatal Transfer Tool  Maternal Diabetes: No Genetic Screening: Normal Maternal Ultrasounds/Referrals: Normal Fetal Ultrasounds or other Referrals:  None Maternal Substance Abuse:  No Significant Maternal Medications:  None Significant Maternal Lab Results:  None Other Comments:  None  Review of Systems  Constitutional: Negative.   HENT: Negative.   Eyes: Negative.   Respiratory: Negative.   Cardiovascular: Negative.   Gastrointestinal: Negative.   Genitourinary: Negative.   Musculoskeletal: Negative.   Skin: Negative.   Neurological: Negative.    Endo/Heme/Allergies: Negative.   Psychiatric/Behavioral: Negative.       Last menstrual period 01/12/2015. Maternal Exam:  Abdomen: Patient reports no abdominal tenderness. Fetal presentation: vertex  Pelvis: adequate for delivery.   Cervix: Cervix evaluated by digital exam.     Fetal Exam Fetal Monitor Review: Baseline rate: 150.  Variability: moderate (6-25 bpm).   Pattern: accelerations present and no decelerations.    Fetal State Assessment: Category I - tracings are normal.     Physical Exam  Constitutional: She is oriented to person, place, and time. She appears well-developed and well-nourished.  HENT:  Head: Normocephalic and atraumatic.  Eyes: Conjunctivae and EOM are normal. Pupils are equal, round, and reactive to light.  Neck: Normal range of motion. Neck supple.  Cardiovascular: Normal rate and regular rhythm.   Respiratory: Effort normal and breath sounds normal.  GI:  Gravida  Genitourinary: Vagina normal.  Musculoskeletal: Normal range of motion.  Neurological: She is alert and oriented to person, place, and time.  Skin: Skin is warm and dry.   Dilation: 3 Effacement (%): 50 Station: Ballotable Presentation: Vertex Exam by:: TWillis RNC   Prenatal labs: ABO, Rh: O/Positive/-- (11/15 1130) Antibody: Negative (03/21 1021) Rubella: 4.63 (11/15 1130) RPR: Non Reactive (03/21 1021)  HBsAg: Negative (11/15 1130)  HIV: Non Reactive (03/21 1021)  GBS: Negative (05/31 1430)   Assessment/Plan: #IOL for cholestasis, LGA confirmed via u/s  Labor: Early, start pitocin, consider AROM Fetal Wellbeing:  Baseline  Pain Control:  Fentanyl, epidural planned Anticipated MOD:  NSVD, risk for shoulder dystocia discussed with patient  Erika Johnston 10/05/2015, 2:26 PM  OB FELLOW HISTORY AND PHYSICAL ATTESTATION  I have seen and examined this patient; I agree with above documentation in the resident's note. Starting ursodiol   Erika Johnston 10/05/2015,  10:01 PM

## 2015-10-05 NOTE — Telephone Encounter (Signed)
Notified pt bile acids elevated, dx cholestasis of pregnancy, recommend delivery at 37wks- which she is now 38.0 today, so to go to hospital for direct admit for IOL. Notified L&D charge RN and resident.  Cheral MarkerKimberly R. Meyli Boice, CNM, [facility] 10/05/2015 11:46 AM

## 2015-10-06 ENCOUNTER — Inpatient Hospital Stay (HOSPITAL_COMMUNITY): Payer: Medicaid Other | Admitting: Anesthesiology

## 2015-10-06 ENCOUNTER — Encounter (HOSPITAL_COMMUNITY): Payer: Self-pay | Admitting: Anesthesiology

## 2015-10-06 ENCOUNTER — Encounter: Payer: BLUE CROSS/BLUE SHIELD | Admitting: Women's Health

## 2015-10-06 DIAGNOSIS — K831 Obstruction of bile duct: Secondary | ICD-10-CM

## 2015-10-06 DIAGNOSIS — O2662 Liver and biliary tract disorders in childbirth: Secondary | ICD-10-CM

## 2015-10-06 DIAGNOSIS — Z3A34 34 weeks gestation of pregnancy: Secondary | ICD-10-CM

## 2015-10-06 DIAGNOSIS — O3663X0 Maternal care for excessive fetal growth, third trimester, not applicable or unspecified: Secondary | ICD-10-CM

## 2015-10-06 LAB — RPR: RPR Ser Ql: NONREACTIVE

## 2015-10-06 MED ORDER — PROMETHAZINE HCL 25 MG/ML IJ SOLN
12.5000 mg | Freq: Four times a day (QID) | INTRAMUSCULAR | Status: DC | PRN
  Administered 2015-10-06: 12.5 mg via INTRAVENOUS
  Filled 2015-10-06: qty 1

## 2015-10-06 MED ORDER — ONDANSETRON HCL 4 MG PO TABS: 4.0000 mg | ORAL_TABLET | ORAL | Status: DC | PRN

## 2015-10-06 MED ORDER — ACETAMINOPHEN 325 MG PO TABS: 650.0000 mg | ORAL_TABLET | ORAL | Status: DC | PRN

## 2015-10-06 MED ORDER — EPHEDRINE 5 MG/ML INJ
10.0000 mg | INTRAVENOUS | Status: AC | PRN
  Administered 2015-10-06 (×2): 10 mg via INTRAVENOUS

## 2015-10-06 MED ORDER — PHENYLEPHRINE 40 MCG/ML (10ML) SYRINGE FOR IV PUSH (FOR BLOOD PRESSURE SUPPORT)
80.0000 ug | PREFILLED_SYRINGE | INTRAVENOUS | Status: AC | PRN
  Administered 2015-10-06 (×3): 80 ug via INTRAVENOUS
  Filled 2015-10-06: qty 10

## 2015-10-06 MED ORDER — SIMETHICONE 80 MG PO CHEW: 80.0000 mg | CHEWABLE_TABLET | ORAL | Status: DC | PRN

## 2015-10-06 MED ORDER — PRENATAL MULTIVITAMIN CH
1.0000 | ORAL_TABLET | Freq: Every day | ORAL | Status: DC
  Administered 2015-10-07: 1 via ORAL
  Filled 2015-10-06: qty 1

## 2015-10-06 MED ORDER — IBUPROFEN 600 MG PO TABS
600.0000 mg | ORAL_TABLET | Freq: Four times a day (QID) | ORAL | Status: DC
  Administered 2015-10-06 – 2015-10-07 (×4): 600 mg via ORAL
  Filled 2015-10-06 (×4): qty 1

## 2015-10-06 MED ORDER — ZOLPIDEM TARTRATE 5 MG PO TABS: 5.0000 mg | ORAL_TABLET | Freq: Every evening | ORAL | Status: DC | PRN

## 2015-10-06 MED ORDER — OXYCODONE-ACETAMINOPHEN 5-325 MG PO TABS: 1.0000 | ORAL_TABLET | ORAL | Status: DC | PRN

## 2015-10-06 MED ORDER — DIBUCAINE 1 % RE OINT: 1.0000 "application " | TOPICAL_OINTMENT | RECTAL | Status: DC | PRN

## 2015-10-06 MED ORDER — LACTATED RINGERS IV SOLN
500.0000 mL | Freq: Once | INTRAVENOUS | Status: DC
Start: 2015-10-06 — End: 2015-10-07

## 2015-10-06 MED ORDER — DOCUSATE SODIUM 100 MG PO CAPS
100.0000 mg | ORAL_CAPSULE | Freq: Two times a day (BID) | ORAL | Status: DC
  Administered 2015-10-06 – 2015-10-07 (×2): 100 mg via ORAL
  Filled 2015-10-06 (×2): qty 1

## 2015-10-06 MED ORDER — FENTANYL 2.5 MCG/ML BUPIVACAINE 1/10 % EPIDURAL INFUSION (WH - ANES)
14.0000 mL/h | INTRAMUSCULAR | Status: DC | PRN
  Administered 2015-10-06 (×2): 14 mL/h via EPIDURAL
  Filled 2015-10-06 (×2): qty 125

## 2015-10-06 MED ORDER — COCONUT OIL OIL: 1.0000 "application " | TOPICAL_OIL | Status: DC | PRN

## 2015-10-06 MED ORDER — WITCH HAZEL-GLYCERIN EX PADS: 1.0000 "application " | MEDICATED_PAD | CUTANEOUS | Status: DC | PRN

## 2015-10-06 MED ORDER — BENZOCAINE-MENTHOL 20-0.5 % EX AERO: 1.0000 "application " | INHALATION_SPRAY | CUTANEOUS | Status: DC | PRN

## 2015-10-06 MED ORDER — PHENYLEPHRINE 40 MCG/ML (10ML) SYRINGE FOR IV PUSH (FOR BLOOD PRESSURE SUPPORT)
80.0000 ug | PREFILLED_SYRINGE | INTRAVENOUS | Status: DC | PRN
  Filled 2015-10-06: qty 5
  Filled 2015-10-06 (×2): qty 10

## 2015-10-06 MED ORDER — EPHEDRINE 5 MG/ML INJ
10.0000 mg | INTRAVENOUS | Status: DC | PRN
  Filled 2015-10-06: qty 4
  Filled 2015-10-06: qty 2

## 2015-10-06 MED ORDER — TETANUS-DIPHTH-ACELL PERTUSSIS 5-2.5-18.5 LF-MCG/0.5 IM SUSP: 0.5000 mL | Freq: Once | INTRAMUSCULAR | Status: DC

## 2015-10-06 MED ORDER — DIPHENHYDRAMINE HCL 25 MG PO CAPS
25.0000 mg | ORAL_CAPSULE | Freq: Four times a day (QID) | ORAL | Status: DC | PRN
  Administered 2015-10-06: 25 mg via ORAL
  Filled 2015-10-06: qty 1

## 2015-10-06 MED ORDER — ONDANSETRON HCL 4 MG/2ML IJ SOLN: 4.0000 mg | INTRAMUSCULAR | Status: DC | PRN

## 2015-10-06 MED ORDER — LIDOCAINE HCL (PF) 1 % IJ SOLN
INTRAMUSCULAR | Status: DC | PRN
  Administered 2015-10-06 (×2): 4 mL via EPIDURAL

## 2015-10-06 MED ORDER — OXYCODONE-ACETAMINOPHEN 5-325 MG PO TABS: 2.0000 | ORAL_TABLET | ORAL | Status: DC | PRN

## 2015-10-06 MED ORDER — SENNOSIDES-DOCUSATE SODIUM 8.6-50 MG PO TABS
2.0000 | ORAL_TABLET | ORAL | Status: DC
  Administered 2015-10-06: 2 via ORAL
  Filled 2015-10-06: qty 2

## 2015-10-06 MED ORDER — DIPHENHYDRAMINE HCL 50 MG/ML IJ SOLN: 12.5000 mg | INTRAMUSCULAR | Status: DC | PRN

## 2015-10-06 NOTE — Anesthesia Rounding Note (Signed)
  CRNA Epidural Rounding Note  Patient: Erika Johnston, 30 y.o., female  Patient's current pain level: 2  Agreed upon pain management level: 4  Epidural intervention: No   Comments:   [facility][name] 10/06/2015

## 2015-10-06 NOTE — Anesthesia Rounding Note (Deleted)
  CRNA Epidural Rounding Note  Patient: Erika Johnston, 30 y.o., female  Patient's current pain level: Pain Score: 1  (10/06/15 0320)  Agreed upon pain management level: 4  Epidural intervention: No   Comments:   Timothy Townsel 10/06/2015

## 2015-10-06 NOTE — Anesthesia Procedure Notes (Signed)
Epidural Patient location during procedure: OB Start time: 10/06/2015 3:06 AM  Staffing Anesthesiologist: Mal AmabileFOSTER, Damoni Erker Performed by: anesthesiologist   Preanesthetic Checklist Completed: patient identified, site marked, surgical consent, pre-op evaluation, timeout performed, IV checked, risks and benefits discussed and monitors and equipment checked  Epidural Patient position: sitting Prep: site prepped and draped and DuraPrep Patient monitoring: continuous pulse ox and blood pressure Approach: midline Location: L3-L4 Injection technique: LOR air  Needle:  Needle type: Tuohy  Needle gauge: 17 G Needle length: 9 cm and 9 Needle insertion depth: 5 cm cm Catheter type: closed end flexible Catheter size: 19 Gauge Catheter at skin depth: 10 cm Test dose: negative and Other  Assessment Events: blood not aspirated, injection not painful, no injection resistance, negative IV test and no paresthesia  Additional Notes Patient identified. Risks and benefits discussed including failed block, incomplete  Pain control, post dural puncture headache, nerve damage, paralysis, blood pressure Changes, nausea, vomiting, reactions to medications-both toxic and allergic and post Partum back pain. All questions were answered. Patient expressed understanding and wished to proceed. Sterile technique was used throughout procedure. Epidural site was Dressed with sterile barrier dressing. No paresthesias, signs of intravascular injection Or signs of intrathecal spread were encountered.  Patient was more comfortable after the epidural was dosed. Please see RN's note for documentation of vital signs and FHR which are stable.

## 2015-10-06 NOTE — Anesthesia Preprocedure Evaluation (Signed)
Anesthesia Evaluation  Patient identified by MRN, date of birth, ID band Patient awake    Reviewed: Allergy & Precautions, NPO status , Patient's Chart, lab work & pertinent test results  Airway Mallampati: III  TM Distance: >3 FB Neck ROM: Full    Dental no notable dental hx. (+) Teeth Intact   Pulmonary neg pulmonary ROS,    Pulmonary exam normal breath sounds clear to auscultation       Cardiovascular negative cardio ROS Normal cardiovascular exam Rhythm:Regular Rate:Normal     Neuro/Psych negative neurological ROS  negative psych ROS   GI/Hepatic Cholestasis of pregnancy   Endo/Other  Obesity  Renal/GU negative Renal ROS  negative genitourinary   Musculoskeletal negative musculoskeletal ROS (+)   Abdominal (+) + obese,   Peds  Hematology  (+) anemia , Thrombocytopenia-mild   Anesthesia Other Findings   Reproductive/Obstetrics (+) Pregnancy                             Anesthesia Physical Anesthesia Plan  ASA: III  Anesthesia Plan: Epidural   Post-op Pain Management:    Induction:   Airway Management Planned: Natural Airway  Additional Equipment:   Intra-op Plan:   Post-operative Plan:   Informed Consent: I have reviewed the patients History and Physical, chart, labs and discussed the procedure including the risks, benefits and alternatives for the proposed anesthesia with the patient or authorized representative who has indicated his/her understanding and acceptance.     Plan Discussed with: Anesthesiologist  Anesthesia Plan Comments:         Anesthesia Quick Evaluation

## 2015-10-06 NOTE — Lactation Note (Signed)
This note was copied from a baby's chart. Lactation Consultation Note  Patient Name: Erika Johnston EAVWU'JToday's Date: 10/06/2015 Reason for consult: Initial assessment Baby at 7 hr of life. Mom reports baby is latching well. She denies breast or nipple pain, voiced no concerns. She let a friend borrow her DEBP and would like a Harmony to take home in case she needs to pump before she is able to replace her DEBP. She pumped to feed for 10 m with her oldest child because he could not latch. She bf her middle child for 2 yr with no issues. Discussed baby behavior, feeding frequency, baby belly size, voids, wt loss, breast changes, and nipple care. She stated she can manually express and has a spoon in the room. Given lactation handouts. Aware of OP services and support group.    Maternal Data Has patient been taught Hand Expression?: Yes Does the patient have breastfeeding experience prior to this delivery?: Yes  Feeding Feeding Type: Breast Fed Length of feed: 40 min  LATCH Score/Interventions                      Lactation Tools Discussed/Used WIC Program: No Pump Review: Setup, frequency, and cleaning;Milk Storage Initiated by:: ES Date initiated:: 10/06/15   Consult Status Consult Status: Follow-up Date: 10/07/15 Follow-up type: In-patient    [name] 10/06/2015, 9:49 PM

## 2015-10-07 MED ORDER — IBUPROFEN 600 MG PO TABS: 600.0000 mg | ORAL_TABLET | Freq: Four times a day (QID) | ORAL | Status: AC | PRN

## 2015-10-07 NOTE — Discharge Instructions (Signed)

## 2015-10-07 NOTE — Anesthesia Postprocedure Evaluation (Signed)
Anesthesia Post Note  Patient: Erika Johnston  Procedure(s) Performed: * No procedures listed *  Patient location during evaluation: Mother Baby Anesthesia Type: Epidural Level of consciousness: awake and alert Pain management: pain level controlled Vital Signs Assessment: post-procedure vital signs reviewed and stable Respiratory status: spontaneous breathing, nonlabored ventilation and respiratory function stable Cardiovascular status: stable Postop Assessment: no headache, no backache and epidural receding Anesthetic complications: no     Last Vitals:  Filed Vitals:   10/06/15 2215 10/07/15 0538  BP: 98/51 99/50  Pulse: 59 57  Temp: 37 C 36.6 C  Resp: 20 18    Last Pain:  Filed Vitals:   10/07/15 0601  PainSc: 0-No pain   Pain Goal: Patients Stated Pain Goal: 0 (10/06/15 2359)               Junious SilkGILBERT,Jaeshaun Riva

## 2015-10-07 NOTE — Discharge Summary (Signed)
OB Discharge Summary     Patient Name: Erika Johnston DOB: 1985-08-05 MRN: [id number]  Date of admission: 10/05/2015 Delivering MD: Olena Leatherwood   Date of discharge: 10/07/2015  Admitting diagnosis: INDUCTION Intrauterine pregnancy: [redacted]w[redacted]d     Secondary diagnosis:  Principal Problem:   Cholestasis of pregnancy in third trimester Active Problems:   Normal spontaneous vaginal delivery  Additional problems: LGA     Discharge diagnosis: Term Pregnancy Delivered                                                                                                Post partum procedures:none  Augmentation: AROM and Pitocin  Complications: None  Hospital course:  Induction of Labor With Vaginal Delivery   30 y.o. yo [id number] at [redacted]w[redacted]d was admitted to the hospital 10/05/2015 for induction of labor.  Indication for induction: cholestasis.  Patient had an uncomplicated labor course as follows: Membrane Rupture Time/Date: 10:26 AM ,10/06/2015   Intrapartum Procedures: Episiotomy: None [1]                                         Lacerations:  1st degree [2];Labial [10]  Patient had delivery of a Viable infant.  Information for the patient's newborn:  Emmamarie, Kluender [[id number]]  Delivery Method: Vaginal, Spontaneous Delivery (Filed from Delivery Summary)   10/06/2015  Details of delivery can be found in separate delivery note.  Patient had a routine postpartum course. Patient is discharged home 10/07/2015.   Physical exam  Filed Vitals:   10/06/15 1640 10/06/15 1737 10/06/15 2215 10/07/15 0538  BP: 100/55 105/44 98/51 99/50   Pulse: 73 60 59 57  Temp: 98.4 F (36.9 C) 98.4 F (36.9 C) 98.6 F (37 C) 97.9 F (36.6 C)  TempSrc: Oral Oral Oral Oral  Resp: Height:      Weight:      SpO2: 100%      General: alert and cooperative Lochia: appropriate Uterine Fundus: firm Incision: N/A DVT Evaluation: No evidence of DVT seen on physical exam. Labs: Lab Results   Component Value Date   WBC 10.0 10/05/2015   HGB 12.1 10/05/2015   HCT 35.5* 10/05/2015   MCV 92.7 10/05/2015   PLT 129* 10/05/2015   CMP Latest Ref Rng 09/30/2015  Glucose 65 - 99 mg/dL 88  BUN 6 - 20 mg/dL 11  Creatinine 1.30 - 8.65 mg/dL 7.84  Sodium 696 - 295 mmol/L 140  Potassium 3.5 - 5.2 mmol/L 4.0  Chloride 96 - 106 mmol/L 104  CO2 18 - 29 mmol/L 20  Calcium 8.7 - 10.2 mg/dL 8.7  Total Protein 6.0 - 8.5 g/dL 6.0  Total Bilirubin 0.0 - 1.2 mg/dL <2.8  Alkaline Phos 39 - 117 IU/L 135(H)  AST 0 - 40 IU/L 12  ALT 0 - 32 IU/L 11    Discharge instruction: per After Visit Summary and "Baby and Me Booklet".  After visit meds:    Medication List  STOP taking these medications        acetaminophen 325 MG tablet  Commonly known as:  TYLENOL      TAKE these medications        ibuprofen 600 MG tablet  Commonly known as:  ADVIL,MOTRIN  Take 1 tablet (600 mg total) by mouth every 6 (six) hours as needed.     prenatal multivitamin Tabs tablet  Take 1 tablet by mouth daily.        Diet: routine diet  Activity: Advance as tolerated. Pelvic rest for 6 weeks.   Outpatient follow up:6 weeks Follow up Appt:No future appointments. Follow up Visit:No Follow-up on file.  Postpartum contraception: Diaphragm  Newborn Data: Live born female  Birth Weight: 8 lb 8.2 oz (3860 g) APGAR: 9, 9  Baby Feeding: Breast Disposition:home with mother   10/07/2015 Cam HaiSHAW, KIMBERLY, CNM  9:07 AM

## 2015-11-23 ENCOUNTER — Encounter: Payer: Self-pay | Admitting: Women's Health

## 2015-11-23 ENCOUNTER — Ambulatory Visit (INDEPENDENT_AMBULATORY_CARE_PROVIDER_SITE_OTHER): Payer: PRIVATE HEALTH INSURANCE | Admitting: Women's Health

## 2015-11-23 DIAGNOSIS — L299 Pruritus, unspecified: Secondary | ICD-10-CM

## 2015-11-23 NOTE — Progress Notes (Signed)
Patient ID: Erika Johnston, female   DOB: 18-Sep-1985, 30 y.o.   MRN: [id number] Subjective:    Erika Johnston is a 30 y.o. [phone] Caucasian female who presents for a postpartum visit. She is 6 weeks postpartum following a spontaneous vaginal delivery at 37+ gestational weeks after IOL for cholestasis of pregnancy. Anesthesia: epidural. I have fully reviewed the prenatal and intrapartum course. Postpartum course has been uncomplicated, but does still occ have itching on foot- no ruq/back pain or n/v after eating. Baby's course has been uncomplicated. Baby is feeding by breast. Bleeding no bleeding. Bowel function is normal. Bladder function is normal. Patient is sexually active. Last sexual activity: ~1wk ago. Contraception method is coitus interruptus and also has diaphragm. Postpartum depression screening: negative. Score 1.  Last pap 08/2014 in [place] and was neg.  The following portions of the patient's history were reviewed and updated as appropriate: allergies, current medications, past medical history, past surgical history and problem list.  Review of Systems Pertinent items are noted in HPI.   Vitals:   11/23/15 1031  BP: 108/68  Pulse: 68  Weight: 201 lb (91.2 kg)   No LMP recorded.  Objective:   General:  alert, cooperative and no distress   Breasts:  deferred, no complaints  Lungs: clear to auscultation bilaterally  Heart:  regular rate and rhythm  Abdomen: soft, nontender   Vulva: normal  Vagina: normal vagina  Cervix:  closed  Corpus: Well-involuted  Adnexa:  Non-palpable  Rectal Exam: Small non-thrombosed external hemorrhoid        Assessment:   Postpartum exam 6 wks s/p SVB after IOL for cholestasis Still w/ Rt foot itching Breastfeeding Depression screening Contraception counseling   Plan:   Contraception: diaphragm Follow up in: prn, going to office closer to her home for pap Discussed persistent Rt foot itching w/ LHE- will get CMP, Amylase, Lipase,  and bile acids today( has been fasting)  Marge Duncans CNM, [facility] 11/23/2015 10:46 AM

## 2015-11-24 LAB — COMPREHENSIVE METABOLIC PANEL
A/G RATIO: 1.7 (ref 1.2–2.2)
ALBUMIN: 4.6 g/dL (ref 3.5–5.5)
ALT: 38 IU/L — ABNORMAL HIGH (ref 0–32)
AST: 21 IU/L (ref 0–40)
Alkaline Phosphatase: 98 IU/L (ref 39–117)
BILIRUBIN TOTAL: 0.4 mg/dL (ref 0.0–1.2)
BUN / CREAT RATIO: 16 (ref 9–23)
BUN: 15 mg/dL (ref 6–20)
CHLORIDE: 100 mmol/L (ref 96–106)
CO2: 24 mmol/L (ref 18–29)
Calcium: 10 mg/dL (ref 8.7–10.2)
Creatinine, Ser: 0.93 mg/dL (ref 0.57–1.00)
GFR calc non Af Amer: 83 mL/min/{1.73_m2} (ref >59)
GFR, EST AFRICAN AMERICAN: 96 mL/min/{1.73_m2} (ref >59)
GLOBULIN, TOTAL: 2.7 g/dL (ref 1.5–4.5)
GLUCOSE: 78 mg/dL (ref 65–99)
Potassium: 4.3 mmol/L (ref 3.5–5.2)
SODIUM: 142 mmol/L (ref 134–144)
TOTAL PROTEIN: 7.3 g/dL (ref 6.0–8.5)

## 2015-11-24 LAB — BILE ACIDS, TOTAL: Bile Acids Total: 8.1 umol/L (ref 4.7–24.5)

## 2015-11-24 LAB — LIPASE: Lipase: 27 U/L (ref 0–59)

## 2015-11-24 LAB — AMYLASE: Amylase: 53 U/L (ref 31–124)

## 2015-11-30 ENCOUNTER — Other Ambulatory Visit: Payer: Self-pay | Admitting: Women's Health

## 2015-11-30 DIAGNOSIS — R7989 Other specified abnormal findings of blood chemistry: Secondary | ICD-10-CM

## 2015-11-30 DIAGNOSIS — R945 Abnormal results of liver function studies: Principal | ICD-10-CM

## 2015-12-22 ENCOUNTER — Encounter: Payer: Self-pay | Admitting: Women's Health

## 2015-12-22 ENCOUNTER — Other Ambulatory Visit: Payer: Self-pay | Admitting: Women's Health

## 2015-12-22 DIAGNOSIS — R7989 Other specified abnormal findings of blood chemistry: Secondary | ICD-10-CM

## 2015-12-22 DIAGNOSIS — R945 Abnormal results of liver function studies: Principal | ICD-10-CM

## 2016-10-17 ENCOUNTER — Encounter: Payer: Self-pay | Admitting: *Deleted

## 2017-09-01 ENCOUNTER — Encounter (HOSPITAL_COMMUNITY): Payer: Self-pay
